# Patient Record
Sex: Male | Born: 1935 | Race: White | Hispanic: No | Marital: Married
Health system: Southern US, Community
[De-identification: ages and names within clinical notes are randomized; demographics above are authoritative.]

## PROBLEM LIST (undated history)

## (undated) DIAGNOSIS — C801 Malignant (primary) neoplasm, unspecified: Secondary | ICD-10-CM

## (undated) HISTORY — PX: APPENDECTOMY: SHX54

## (undated) HISTORY — DX: Malignant (primary) neoplasm, unspecified: C80.1

## (undated) HISTORY — PX: PROSTATE SURGERY: SHX751

## (undated) HISTORY — PX: KIDNEY STONE SURGERY: SHX686

---

## 2010-12-08 ENCOUNTER — Emergency Department: Payer: Self-pay | Admitting: Emergency Medicine

## 2018-12-06 ENCOUNTER — Other Ambulatory Visit: Payer: Self-pay

## 2018-12-06 ENCOUNTER — Ambulatory Visit: Payer: Medicare Other | Attending: Rehabilitative and Restorative Service Providers"

## 2018-12-06 DIAGNOSIS — M79601 Pain in right arm: Secondary | ICD-10-CM | POA: Insufficient documentation

## 2018-12-06 DIAGNOSIS — M79602 Pain in left arm: Secondary | ICD-10-CM | POA: Insufficient documentation

## 2018-12-06 DIAGNOSIS — M25511 Pain in right shoulder: Secondary | ICD-10-CM | POA: Diagnosis present

## 2018-12-06 DIAGNOSIS — G8929 Other chronic pain: Secondary | ICD-10-CM | POA: Insufficient documentation

## 2018-12-06 DIAGNOSIS — M25512 Pain in left shoulder: Secondary | ICD-10-CM | POA: Insufficient documentation

## 2018-12-06 NOTE — Therapy (Signed)
[facility] [address], [place], [address] Phone: [phone]   Fax:  [phone]  Physical Therapy Evaluation  Patient Details  Name: Evan Forbes. MRN: [id number] Date of Birth: 1931/12/01 Referring Provider (PT): Birdena Jubilee, [place]   Encounter Date: 12/06/2018  PT End of Session - 12/06/18 1459    Visit Number  1    Number of Visits  13    Date for PT Re-Evaluation  01/20/19    PT Start Time  1501    PT Stop Time  1602    PT Time Calculation (min)  61 min    Activity Tolerance  Patient tolerated treatment well    Behavior During Therapy  [facility] for tasks assessed/performed       Past Medical History:  Diagnosis Date  . 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 APPENDECTOMY    . KIDNEY STONE SURGERY    . PROSTATE SURGERY      There were no vitals filed for this visit.   Subjective Assessment - 12/06/18 1501    Subjective  R UE: anterior arm and lateral arm. No superior shoulder pain: 2/10 currently, 6/10 at worst for the past 2 months;  L UE pain: lateral arm, no superior shoulder pain: 0/10 currently 2/10 L lateral arm at worst for the past 2 months.    Pertinent History  B shoulder pain. Pt pulled a heavy barrel behind him with his shoulders in bilateral extension for 3 days. Neck pain started but it eased off after a shot in his neck for a while he could not move but no can. Took 3 months for his movements to return. Had a steroid shot in his R shoulder about 3 weeks ago which felt better temporarily. Shoulder pain comes and goes. Yesterday felt no pain but has pain today.Neck pain has not returned. Did pendulums B shoulders which helped.   B shoulder pain and neck began February 2020.   Patient Stated Goals  Be able to do his pistol shooting, lift about 40-50 lbs like he used too, such as a boat, log, or battery.    Currently  in Pain?  Yes    Pain Score  2     Pain Location  Arm    Pain Orientation  Right;Left    Pain Descriptors / Indicators  Tightness;Stabbing    Pain Type  Chronic pain    Pain Onset  More than a month ago    Pain Frequency  Occasional    Aggravating Factors   Moving his arms away from his side. Working at the computer 3-4 hours, lifting heavy items (about 20-30 lbs), pistol shooting (R shoulder scaption to 90 degrees, feels pain along the C5/6 dermatome). Rolling onto his R or L shoulder    Pain Relieving Factors  Keeping his arms at his side.         [facility] PT Assessment - 12/06/18 1516      Assessment   Medical Diagnosis  Bilateral shoulder pain    Referring Provider (PT)  Birdena Jubilee, PA-C    Onset Date/Surgical Date  11/09/18    Hand Dominance  Right    Prior Therapy  No known PT for current condition      Precautions   Precaution Comments  no known precautions      Restrictions   Other  Position/Activity Restrictions  No known restrictions      Balance Screen   Has the patient fallen in the past 6 months  No    Has the patient had a decrease in activity level because of a fear of falling?   No    Is the patient reluctant to leave their home because of a fear of falling?   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   (-)  Michel Bickers R, and L. Posterior medial arm tightness R for Yocum test. (-) Yocum test L. (+) Neer's impingement R and L shoulder      Posture/Postural Control   Posture Comments  protracted neck, B protracted shoulders with shoulder shrugs, kyphosis, R shoulder slightly lower, R greater trochanter and R knee higher. Neck: movement preference to C4/5 and C5/6      AROM   Right Shoulder Flexion  98 Degrees   110 AAROM, no pain, stiff end feel   Right Shoulder ABduction  92 Degrees    with pain; 112 AAROM, no pain    Left Shoulder Flexion  122 Degrees   130 AAROM with anterior arm tightness; stiff end feel   Left Shoulder ABduction  132 Degrees   with lateral arm tightness; 148 AAROM   Cervical Flexion  WFL    Cervical Extension  WFL with neck pain, no arm pain    Cervical - Right Side Bend  WFL    Cervical - Left Side Bend  WFL    Cervical - Right Rotation  WFL    Cervical - Left Rotation  Full with L superior lateral neck pain      Strength   Right Shoulder Flexion  4+/5    Right Shoulder ABduction  4+/5    Right Shoulder Internal Rotation  4+/5    Right Shoulder External Rotation  4/5    Left Shoulder Flexion  4+/5    Left Shoulder ABduction  4+/5    Left Shoulder Internal Rotation  4+/5    Left Shoulder External Rotation  4/5      Palpation   Palpation comment  No TTP B shoulders, R rhomboid tighness. Slight posterior shoulder tightness B                Objective measurements completed on examination: See above findings.    Pt states not taking any medications   No latex band allergies.   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  No blood pressure problems per pt  R hand dominant    Eventually work towards gym routine secondary to pt stating that he goes to a gym.    Medbridge Access Code: [email or web address]  Therapeutic exercise    B scapular retraction 10x2 with 5 seconds   Standing B shoulder ER yellow band 10x2    Improved exercise technique, movement at target joints, use of target muscles after mod verbal, visual, tactile cues.     105 degrees R shoulder flexion AROM afterwards. Tightness felt by pt at end range.    Pt is an 85 year old male who came to physical therapy secondary to B shoulder pain R > L  since February 2020. He also presents with B shoulder protraction and thoracic kyphosis, decreased B glenohumeral control, B  scapular weakness, positive special test suggesting bilateral shoulder impingement, infraspinatus and teres minor muscle weakness, and difficulty performing functional tasks such as reaching and carrying heavier items such as a log. Pt will benefit from continued skilled physical therapy services to decrease pain, improve posture, scapular and shoulder strength, and improve ability to perform functional tasks.      PT Short Term Goals - 12/06/18 1617      PT SHORT TERM GOAL #1   Title  Pt will be independent with his HEP to improve ability to reach and carry items with less shoulder and arm discomfort.    Baseline  Pt has started his HEP (12/06/2018)    Time  3    Period  Weeks    Status  New    Target Date  12/30/18                  PT Long Term Goals - 12/06/18 1618      PT LONG TERM GOAL #1   Title  Patient will have a decrease in R shoulder pain to 2/10 or less at worst and L shoulder pain to 1/10 or less at worst to promote ability to reach, as well as carry heavier items.    Baseline  6/10 R shoulder, 2/10 L shoulder pain at worst for the past 2 months (12/06/2018)    Time  6    Period  Weeks    Status  New    Target Date  01/20/19      PT LONG TERM GOAL #2   Title  Patient will improve R shoulder flexion and abduction AROM to 120 degrees or more to promote ability to reach as well as shoot his pistol.    Baseline  R shoulder AROM:  flexion 98 degrees, abduction 92 degrees (12/06/2018)    Time  6    Period  Weeks    Status  New    Target Date  01/20/19      PT LONG TERM GOAL #3   Title  Patient will improve L shoulder flexion and abduction AROM to at least 140 degrees to promote ability to reach more comfortably.    Baseline  R shoulder AROM: flexion 122 degrees, abduction 132 degrees (12/06/2018)    Time  6    Period  Weeks    Status  New    Target Date  01/20/19      PT LONG TERM GOAL #4   Title  Patient will improve R and L shoulder ER strength to at least  4+/5 to promote ability to reach with less pain.    Baseline  4/5 B shoulder ER strength (12/06/2018)    Time  6    Period  Weeks    Status  New    Target Date  01/20/19             Plan - 12/06/18 1611    Clinical Impression Statement  Pt is an 85 year old male who came to physical therapy secondary to B shoulder pain R > L since February 2020. He also presents with B shoulder protraction and thoracic kyphosis, decreased B glenohumeral control, B scapular weakness, positive special test suggesting bilateral shoulder impingement, infraspinatus and teres minor muscle weakness, and difficulty performing functional tasks such as reaching and carrying heavier items such as a log. Pt will benefit from  continued skilled physical therapy services to decrease pain, improve posture, scapular and shoulder strength, and improve ability to perform functional tasks.    Personal Factors and Comorbidities  Age;Time since onset of injury/illness/exacerbation    Examination-Activity Limitations  Lift;Reach Overhead;Carry    Stability/Clinical Decision Making  Stable/Uncomplicated    Clinical Decision Making  Low    Rehab Potential  Good    PT Frequency  2x / week    PT Duration  6 weeks    PT Treatment/Interventions  Electrical Stimulation;Iontophoresis 4mg /ml Dexamethasone;Therapeutic activities;Therapeutic exercise;Neuromuscular re-education;Patient/family education;Manual techniques;Passive range of motion;Dry needling;Joint Manipulations;Spinal Manipulations   manipulations if appropriate   PT Next Visit Plan  scapular and ER muscle strengthening, glenohumeral control, manual techniques, modalities PRN    PT Home Exercise Plan  Medbridge Access Code: [email or web address]    HKGOVPCHE and Agree with Plan of Care  Patient       Patient will benefit from skilled therapeutic intervention in order to improve the following deficits and impairments:  Decreased range of motion, Decreased strength, Hypomobility,  Impaired UE functional use, Improper body mechanics, Postural dysfunction, Pain  Visit Diagnosis: 1. Pain in right arm   2. Chronic right shoulder pain   3. Chronic left shoulder pain   4. Pain in left arm        Problem List There are no active problems to display for this patient.  Joneen Boers PT, DPT   12/06/2018, 4:53 PM  [facility] [address] [place], [address] Phone: [phone]   Fax:  [phone]  Name: Evan Forbes. MRN: [id number] Date of Birth: [birth year]

## 2018-12-06 NOTE — Patient Instructions (Signed)
Medbridge Access Code: [email or web address]   Seated Scapular Retraction 10x3 with 5 seconds

## 2018-12-08 ENCOUNTER — Ambulatory Visit: Payer: Medicare Other

## 2018-12-09 ENCOUNTER — Ambulatory Visit: Payer: Medicare Other | Attending: Rehabilitative and Restorative Service Providers"

## 2018-12-09 ENCOUNTER — Other Ambulatory Visit: Payer: Self-pay

## 2018-12-09 DIAGNOSIS — M25512 Pain in left shoulder: Secondary | ICD-10-CM | POA: Diagnosis present

## 2018-12-09 DIAGNOSIS — M25511 Pain in right shoulder: Secondary | ICD-10-CM | POA: Insufficient documentation

## 2018-12-09 DIAGNOSIS — M79602 Pain in left arm: Secondary | ICD-10-CM | POA: Insufficient documentation

## 2018-12-09 DIAGNOSIS — M79601 Pain in right arm: Secondary | ICD-10-CM | POA: Diagnosis present

## 2018-12-09 DIAGNOSIS — G8929 Other chronic pain: Secondary | ICD-10-CM | POA: Diagnosis present

## 2018-12-09 NOTE — Patient Instructions (Signed)
Medbridge Access Code: [email or web address]  Seated chin tucks with scapular retraction 10x3 with 5 second holds

## 2018-12-09 NOTE — Therapy (Signed)
[facility] [address], [place], [address] Phone: [phone]   Fax:  [phone]  Physical Therapy Treatment  Patient Details  Name: Evan Forbes. MRN: [id number] Date of Birth: 04/04/1932 Referring Provider (PT): Birdena Jubilee, [place]   Encounter Date: 12/09/2018  PT End of Session - 12/09/18 1701    Visit Number  2    Number of Visits  13    Date for PT Re-Evaluation  01/20/19    PT Start Time  1701    PT Stop Time  1751    PT Time Calculation (min)  50 min    Activity Tolerance  Patient tolerated treatment well    Behavior During Therapy  [facility] for tasks assessed/performed       Past Medical History:  Diagnosis Date  . 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 APPENDECTOMY    . KIDNEY STONE SURGERY    . PROSTATE SURGERY      There were no vitals filed for this visit.  Subjective Assessment - 12/09/18 1701    Subjective  Shoulders are not as good. Less than 1/10 both shoulders but feels pain along bottom of his arms when he raises them up to the side.    Pertinent History  B shoulder pain. Pt pulled a heavy barrel behind him with his shoulders in bilateral extension for 3 days. Neck pain started but it eased off after a shot in his neck for a while he could not move but no can. Took 3 months for his movements to return. Had a steroid shot in his R shoulder about 3 weeks ago which felt better temporarily. Shoulder pain comes and goes. Yesterday felt no pain but has pain today.Neck pain has not returned. Did pendulums B shoulders which helped.   B shoulder pain and neck began February 2020.   Patient Stated Goals  Be able to do his pistol shooting, lift about 40-50 lbs like he used too, such as a boat, log, or battery.    Currently in Pain?  Yes    Pain Score  1     Pain Onset  More than a month ago                                PT Education - 12/09/18 1752    Education Details  ther-ex, HEP    Person(s) Educated  Patient    Methods  Explanation;Demonstration;Tactile cues;Verbal cues;Handout    Comprehension  Returned demonstration;Verbalized understanding       Objectives  Medbridge Access Code: [email or web address]  Therapeutic exercise  R Isometric shoulder  ER 10x5 seconds for 2 sets. Good posterior glide of humeral head felt  IR 10x5 seconds  Flexion 10x5 seconds for 2 sets  Seated self inferior shoulder glide  R 10x5 seconds for 2 sets   R shoulder abduction: R radial nerve tension   Seated chin tucks with scapular retraction 10x3 with 5 second holds to promote upper thoracic extension, decreased lower cervical pressure, and improve scapular retraction and posterior tipping.    Improved R shoulder flexion and abduction AROM, no R radial nerve symptoms afterwards.   Improved exercise technique, movement at target joints, use of target muscles after mod verbal, visual, tactile cues.     Manual therapy Seated STM R  upper trap/rhomboid, and infraspinatus muscles     Response to treatment Difficulty with scapular control but improves with cues. Improved R shoulder AROM with decreased pain after session.    Clinical impression Improved R shoulder flexion and abduction AROM observed and decreased R radial nerve symptoms after promoting upper thoracic extension, and scapular retraction. Pt needed moderate cueing to decrease shoulder shrug with exercises. Pt tolerated session well without aggravation of symptoms. Pt will benefit from continued skilled physical therapy services to decrease pain, improve shoulder AROM, strength, and function.        PT Short Term Goals - 12/06/18 1617      PT SHORT TERM GOAL #1   Title  Pt will be independent with his HEP to improve ability to reach and carry items with less shoulder and arm discomfort.    Baseline  Pt  has started his HEP (12/06/2018)    Time  3    Period  Weeks    Status  New    Target Date  12/30/18        PT Long Term Goals - 12/06/18 1618      PT LONG TERM GOAL #1   Title  Patient will have a decrease in R shoulder pain to 2/10 or less at worst and L shoulder pain to 1/10 or less at worst to promote ability to reach, as well as carry heavier items.    Baseline  6/10 R shoulder, 2/10 L shoulder pain at worst for the past 2 months (12/06/2018)    Time  6    Period  Weeks    Status  New    Target Date  01/20/19      PT LONG TERM GOAL #2   Title  Patient will improve R shoulder flexion and abduction AROM to 120 degrees or more to promote ability to reach as well as shoot his pistol.    Baseline  R shoulder AROM:  flexion 98 degrees, abduction 92 degrees (12/06/2018)    Time  6    Period  Weeks    Status  New    Target Date  01/20/19      PT LONG TERM GOAL #3   Title  Patient will improve L shoulder flexion and abduction AROM to at least 140 degrees to promote ability to reach more comfortably.    Baseline  R shoulder AROM: flexion 122 degrees, abduction 132 degrees (12/06/2018)    Time  6    Period  Weeks    Status  New    Target Date  01/20/19      PT LONG TERM GOAL #4   Title  Patient will improve R and L shoulder ER strength to at least 4+/5 to promote ability to reach with less pain.    Baseline  4/5 B shoulder ER strength (12/06/2018)    Time  6    Period  Weeks    Status  New    Target Date  01/20/19            Plan - 12/09/18 1701    Clinical Impression Statement  Improved R shoulder flexion and abduction AROM observed and decreased R radial nerve symptoms after promoting upper thoracic extension, and scapular retraction. Pt needed moderate cueing to decrease shoulder shrug with exercises. Pt tolerated session well without aggravation of symptoms. Pt will benefit from continued skilled physical therapy services to decrease pain, improve shoulder AROM, strength,  and function.    Personal Factors and  Comorbidities  Age;Time since onset of injury/illness/exacerbation    Examination-Activity Limitations  Lift;Reach Overhead;Carry    Stability/Clinical Decision Making  Stable/Uncomplicated    Rehab Potential  Good    PT Frequency  2x / week    PT Duration  6 weeks    PT Treatment/Interventions  Electrical Stimulation;Iontophoresis 4mg /ml Dexamethasone;Therapeutic activities;Therapeutic exercise;Neuromuscular re-education;Patient/family education;Manual techniques;Passive range of motion;Dry needling;Joint Manipulations;Spinal Manipulations   manipulations if appropriate   PT Next Visit Plan  scapular and ER muscle strengthening, glenohumeral control, manual techniques, modalities PRN    PT Home Exercise Plan  Medbridge Access Code: [email or web address]    [email or web address] and Agree with Plan of Care  Patient       Patient will benefit from skilled therapeutic intervention in order to improve the following deficits and impairments:  Decreased range of motion, Decreased strength, Hypomobility, Impaired UE functional use, Improper body mechanics, Postural dysfunction, Pain  Visit Diagnosis: 1. Chronic right shoulder pain   2. Chronic left shoulder pain   3. Pain in right arm   4. Pain in left arm        Problem List There are no active problems to display for this patient.  Joneen Boers PT, DPT   12/09/2018, 6:07 PM  [facility] [address] [place], [address] Phone: [phone]   Fax:  [phone]  Name: Evan Forbes. MRN: [id number] Date of Birth: Mar 22, 1932

## 2018-12-13 ENCOUNTER — Ambulatory Visit: Payer: Medicare Other

## 2018-12-14 ENCOUNTER — Ambulatory Visit: Payer: Medicare Other

## 2018-12-14 ENCOUNTER — Other Ambulatory Visit: Payer: Self-pay

## 2018-12-14 DIAGNOSIS — M79602 Pain in left arm: Secondary | ICD-10-CM

## 2018-12-14 DIAGNOSIS — M79601 Pain in right arm: Secondary | ICD-10-CM

## 2018-12-14 DIAGNOSIS — G8929 Other chronic pain: Secondary | ICD-10-CM

## 2018-12-14 DIAGNOSIS — M25511 Pain in right shoulder: Secondary | ICD-10-CM | POA: Diagnosis not present

## 2018-12-14 NOTE — Therapy (Signed)
[facility] [address] [place], [address] Phone: [phone]   Fax:  [phone]  Physical Therapy Treatment  Patient Details  Name: Evan Forbes. MRN: [id number] Date of Birth: Jun 22, 1931 Referring Provider (PT): Birdena Jubilee, [place]   Encounter Date: 12/14/2018  PT End of Session - 12/14/18 1503    Visit Number  3    Number of Visits  13    Date for PT Re-Evaluation  01/20/19    PT Start Time  1503    PT Stop Time  1552    PT Time Calculation (min)  49 min    Activity Tolerance  Patient tolerated treatment well    Behavior During Therapy  [facility] for tasks assessed/performed       Past Medical History:  Diagnosis Date  . 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 APPENDECTOMY    . KIDNEY STONE SURGERY    . PROSTATE SURGERY      There were no vitals filed for this visit.  Subjective Assessment - 12/14/18 1504    Subjective  The shoulders are coming along. The arms feel weak. No pain currently. Still feels tighness R inferior arm (radial nerve) with shoulder scaption. No symptoms L shoulder currently.    Pertinent History  B shoulder pain. Pt pulled a heavy barrel behind him with his shoulders in bilateral extension for 3 days. Neck pain started but it eased off after a shot in his neck for a while he could not move but no can. Took 3 months for his movements to return. Had a steroid shot in his R shoulder about 3 weeks ago which felt better temporarily. Shoulder pain comes and goes. Yesterday felt no pain but has pain today.Neck pain has not returned. Did pendulums B shoulders which helped.   B shoulder pain and neck began February 2020.   Patient Stated Goals  Be able to do his pistol shooting, lift about 40-50 lbs like he used too, such as a boat, log, or battery.    Currently in Pain?  No/denies    Pain Score  1    0.5/10 R  shoulder   Pain Onset  More than a month ago                               PT Education - 12/14/18 1555    Education Details  ther-ex    Person(s) Educated  Patient    Methods  Explanation;Demonstration;Tactile cues;Verbal cues    Comprehension  Returned demonstration;Verbalized understanding      Objectives  MedbridgeAccess Code: [email or web address]  Manual therapy  Supine STM R teres major  Supine STM  R pectoralis muscle to decrease tightness   improved R shoulder scaption AROM without R radial nerve symptoms.     Therapeutic exercise  Seated thoracic extension over chair 10x3 with 5 second holds to promote thoracic extension   Reclined position  Shoulder AAROM with PT    Flexion     R 10x   Standing door pectoralis stretch, arms and hands low  R 5x10 seconds  L 5x10 seconds   Standing shoulder ER at comfortable range  R yellow band 10x3  L yellow band 10x3   Standing B scapular retraction red band 10x5 seconds for 2 sets   Improved exercise  technique, movement at target joints, use of target muscles after mod verbal, visual, tactile cues.          Response to treatment Difficulty with scapular control but improves with cues. Improved R shoulder AROM with decreased pain after session.    Clinical impression Improved R shoulder scaption with improved comfort following treatment to decrease teres major, pectoralis major muscle tension as well as improving infraspinatus, and scapular retractor muscle use. Improved L shoulder scaption AROM and comfort level observed as well after infraspinatus and scapular muscle strengthening exercises. Pt tolerated session well without aggravation of symptoms. Pt will benefit from continued skilled physical therapy services to decrease pain, improve strength, AROM and function.       PT Short Term Goals - 12/06/18 1617      PT SHORT TERM GOAL #1   Title  Pt will be independent with his HEP to  improve ability to reach and carry items with less shoulder and arm discomfort.    Baseline  Pt has started his HEP (12/06/2018)    Time  3    Period  Weeks    Status  New    Target Date  12/30/18        PT Long Term Goals - 12/06/18 1618      PT LONG TERM GOAL #1   Title  Patient will have a decrease in R shoulder pain to 2/10 or less at worst and L shoulder pain to 1/10 or less at worst to promote ability to reach, as well as carry heavier items.    Baseline  6/10 R shoulder, 2/10 L shoulder pain at worst for the past 2 months (12/06/2018)    Time  6    Period  Weeks    Status  New    Target Date  01/20/19      PT LONG TERM GOAL #2   Title  Patient will improve R shoulder flexion and abduction AROM to 120 degrees or more to promote ability to reach as well as shoot his pistol.    Baseline  R shoulder AROM:  flexion 98 degrees, abduction 92 degrees (12/06/2018)    Time  6    Period  Weeks    Status  New    Target Date  01/20/19      PT LONG TERM GOAL #3   Title  Patient will improve L shoulder flexion and abduction AROM to at least 140 degrees to promote ability to reach more comfortably.    Baseline  R shoulder AROM: flexion 122 degrees, abduction 132 degrees (12/06/2018)    Time  6    Period  Weeks    Status  New    Target Date  01/20/19      PT LONG TERM GOAL #4   Title  Patient will improve R and L shoulder ER strength to at least 4+/5 to promote ability to reach with less pain.    Baseline  4/5 B shoulder ER strength (12/06/2018)    Time  6    Period  Weeks    Status  New    Target Date  01/20/19            Plan - 12/14/18 1502    Clinical Impression Statement  Improved R shoulder scaption with improved comfort following treatment to decrease teres major, pectoralis major muscle tension as well as improving infraspinatus, and scapular retractor muscle use. Improved L shoulder scaption AROM and comfort level observed as well after  infraspinatus and scapular muscle  strengthening exercises. Pt tolerated session well without aggravation of symptoms. Pt will benefit from continued skilled physical therapy services to decrease pain, improve strength, AROM and function.    Personal Factors and Comorbidities  Age;Time since onset of injury/illness/exacerbation    Examination-Activity Limitations  Lift;Reach Overhead;Carry    Stability/Clinical Decision Making  Stable/Uncomplicated    Rehab Potential  Good    PT Frequency  2x / week    PT Duration  6 weeks    PT Treatment/Interventions  Electrical Stimulation;Iontophoresis 4mg /ml Dexamethasone;Therapeutic activities;Therapeutic exercise;Neuromuscular re-education;Patient/family education;Manual techniques;Passive range of motion;Dry needling;Joint Manipulations;Spinal Manipulations   manipulations if appropriate   PT Next Visit Plan  scapular and ER muscle strengthening, glenohumeral control, manual techniques, modalities PRN    PT Home Exercise Plan  Medbridge Access Code: [email or web address]    [email or web address] and Agree with Plan of Care  Patient       Patient will benefit from skilled therapeutic intervention in order to improve the following deficits and impairments:  Decreased range of motion, Decreased strength, Hypomobility, Impaired UE functional use, Improper body mechanics, Postural dysfunction, Pain  Visit Diagnosis: 1. Chronic right shoulder pain   2. Chronic left shoulder pain   3. Pain in right arm   4. Pain in left arm        Problem List There are no active problems to display for this patient.   Joneen Boers PT, DPT   12/14/2018, 3:58 PM  [facility] [address], [place], [address] Phone: [phone]   Fax:  [phone]  Name: Evan Forbes. MRN: [id number] Date of Birth: 1932-03-23

## 2018-12-16 ENCOUNTER — Ambulatory Visit: Payer: Medicare Other

## 2018-12-21 ENCOUNTER — Ambulatory Visit: Payer: Medicare Other

## 2018-12-23 ENCOUNTER — Ambulatory Visit: Payer: Medicare Other

## 2018-12-23 ENCOUNTER — Other Ambulatory Visit: Payer: Self-pay

## 2018-12-23 DIAGNOSIS — M25512 Pain in left shoulder: Secondary | ICD-10-CM

## 2018-12-23 DIAGNOSIS — M79601 Pain in right arm: Secondary | ICD-10-CM

## 2018-12-23 DIAGNOSIS — M79602 Pain in left arm: Secondary | ICD-10-CM

## 2018-12-23 DIAGNOSIS — M25511 Pain in right shoulder: Secondary | ICD-10-CM | POA: Diagnosis not present

## 2018-12-23 DIAGNOSIS — G8929 Other chronic pain: Secondary | ICD-10-CM

## 2018-12-23 NOTE — Therapy (Signed)
[facility] [address], [place], [address] Phone: [phone]   Fax:  [phone]  Physical Therapy Treatment  Patient Details  Name: Evan Forbes. MRN: [id number] Date of Birth: 1931-12-16 Referring Provider (PT): Birdena Jubilee, [place]   Encounter Date: 12/23/2018  PT End of Session - 12/23/18 1644    Visit Number  4    Number of Visits  13    Date for PT Re-Evaluation  01/20/19    PT Start Time  3151    PT Stop Time  7616    PT Time Calculation (min)  40 min    Activity Tolerance  Patient tolerated treatment well;No increased pain    Behavior During Therapy  WFL for tasks assessed/performed       Past Medical History:  Diagnosis Date  . 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 APPENDECTOMY    . KIDNEY STONE SURGERY    . PROSTATE SURGERY      There were no vitals filed for this visit.  Subjective Assessment - 12/23/18 1643    Subjective  Pt doing well, pain remain next to none, but tightness persists in the Rt shoulder with related weakness.    Pertinent History  B shoulder pain. Pt pulled a heavy barrel behind him with his shoulders in bilateral extension for 3 days. Neck pain started but it eased off after a shot in his neck for a while he could not move but no can. Took 3 months for his movements to return. Had a steroid shot in his R shoulder about 3 weeks ago which felt better temporarily. Shoulder pain comes and goes. Yesterday felt no pain but has pain today.Neck pain has not returned. Did pendulums B shoulders which helped.    Currently in Pain?  No/denies       INTERVENTION  -bue shoulder table slides (flexion) 15x3secH (great response and improved motion) -supine wand flexion over towel roll 1x20x3sec  -Right shoulder long axis distraction 2x30sec -Right shoulder joint mobilization: 2x30sec each (posterior glid in  ER, lateral distraction at 90 degrees flexion, lateral distraction in neutral) the latter with  -Supine Right shoulder flexon 3x10 c 1lb, 2lb, 3lb.  -Supine Right shoulder IR from 90/90 ER (available end range ~70 degrees) to neutral. -Right standing Shoulder Flexion 2x10: 1lb, then 2lb (stopping at 90) -standing Right shoulder abduction 1x10 c 2lb weight (stopping at 90)  *pt has opted to move down to 1x weekly to simplify his busy life, hence he will need updated HEP for self mobilization, advanced stretching, and free weight strengthening, the latter at patient request.    PT Short Term Goals - 12/06/18 1617      PT SHORT TERM GOAL #1   Title  Pt will be independent with his HEP to improve ability to reach and carry items with less shoulder and arm discomfort.    Baseline  Pt has started his HEP (12/06/2018)    Time  3    Period  Weeks    Status  New    Target Date  12/30/18        PT Long Term Goals - 12/06/18 1618      PT LONG TERM GOAL #1   Title  Patient will have a decrease in R shoulder pain to 2/10 or less at worst and L shoulder pain to 1/10  or less at worst to promote ability to reach, as well as carry heavier items.    Baseline  6/10 R shoulder, 2/10 L shoulder pain at worst for the past 2 months (12/06/2018)    Time  6    Period  Weeks    Status  New    Target Date  01/20/19      PT LONG TERM GOAL #2   Title  Patient will improve R shoulder flexion and abduction AROM to 120 degrees or more to promote ability to reach as well as shoot his pistol.    Baseline  R shoulder AROM:  flexion 98 degrees, abduction 92 degrees (12/06/2018)    Time  6    Period  Weeks    Status  New    Target Date  01/20/19      PT LONG TERM GOAL #3   Title  Patient will improve L shoulder flexion and abduction AROM to at least 140 degrees to promote ability to reach more comfortably.    Baseline  R shoulder AROM: flexion 122 degrees, abduction 132 degrees (12/06/2018)    Time  6    Period   Weeks    Status  New    Target Date  01/20/19      PT LONG TERM GOAL #4   Title  Patient will improve R and L shoulder ER strength to at least 4+/5 to promote ability to reach with less pain.    Baseline  4/5 B shoulder ER strength (12/06/2018)    Time  6    Period  Weeks    Status  New    Target Date  01/20/19            Plan - 12/23/18 1650    Clinical Impression Statement  Continued to focus on improving shoulder ROM and strength, especially on Right. Pt has gradual improved ROM in Rt shoulder with repitition. Pt is mesmorized by improvements in ROM within session. Pt contnues to progress well, but must be encouraged to avoid severe pain with stretching.    Rehab Potential  Good    PT Frequency  2x / week    PT Treatment/Interventions  Electrical Stimulation;Iontophoresis 4mg /ml Dexamethasone;Therapeutic activities;Therapeutic exercise;Neuromuscular re-education;Patient/family education;Manual techniques;Passive range of motion;Dry needling;Joint Manipulations;Spinal Manipulations    PT Next Visit Plan  scapular and ER muscle strengthening, glenohumeral control, manual techniques, modalities PRN    PT Home Exercise Plan  Medbridge Access Code: [email or web address]    [email or web address] and Agree with Plan of Care  Patient       Patient will benefit from skilled therapeutic intervention in order to improve the following deficits and impairments:  Decreased range of motion, Decreased strength, Hypomobility, Impaired UE functional use, Improper body mechanics, Postural dysfunction, Pain  Visit Diagnosis: 1. Chronic right shoulder pain   2. Chronic left shoulder pain   3. Pain in right arm   4. Pain in left arm        Problem List There are no active problems to display for this patient.  5:16 PM, 12/23/18 Etta Grandchild, PT, DPT Physical Therapist - [place] [phone] (Office)    Jamien Casanova C 12/23/2018, 5:03 PM  [facility] [address], [place], [address] Phone: [phone]   Fax:  [phone]  Name: Evan Forbes. MRN: [id number] Date of Birth: February 20, 1932

## 2018-12-27 ENCOUNTER — Ambulatory Visit: Payer: Medicare Other

## 2018-12-29 ENCOUNTER — Ambulatory Visit: Payer: Medicare Other

## 2018-12-30 ENCOUNTER — Other Ambulatory Visit: Payer: Self-pay

## 2018-12-30 ENCOUNTER — Ambulatory Visit: Payer: Medicare Other

## 2018-12-30 DIAGNOSIS — M79602 Pain in left arm: Secondary | ICD-10-CM

## 2018-12-30 DIAGNOSIS — M25511 Pain in right shoulder: Secondary | ICD-10-CM | POA: Diagnosis not present

## 2018-12-30 DIAGNOSIS — M79601 Pain in right arm: Secondary | ICD-10-CM

## 2018-12-30 DIAGNOSIS — G8929 Other chronic pain: Secondary | ICD-10-CM

## 2018-12-30 NOTE — Patient Instructions (Addendum)
MedbridgeAccess Code: [email or web address]   Seated Shoulder Inferior Glide  10x2 with 10 seconds 1-2x daily   Shoulder flexion to 90 degrees 1-2 lbs each UE 10x2 daily.   shoulder abduction thumbs up to 90 degrees 1-2 lbs each UE 1x daily.   Seated thoracic extension 10x3 with 5 second holds 1-2x/day

## 2018-12-30 NOTE — Therapy (Signed)
[facility] [address], [place], [address] Phone: [phone]   Fax:  [phone]  Physical Therapy Treatment  Patient Details  Name: Evan Forbes. MRN: [id number] Date of Birth: [birth year] Referring Provider (PT): Birdena Jubilee, [place]   Encounter Date: 12/30/2018  PT End of Session - 12/30/18 1650    Visit Number  5    Number of Visits  13    Date for PT Re-Evaluation  01/20/19    PT Start Time  3785    PT Stop Time  8850    PT Time Calculation (min)  65 min    Activity Tolerance  Patient tolerated treatment well;No increased pain    Behavior During Therapy  WFL for tasks assessed/performed       Past Medical History:  Diagnosis Date  . 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 APPENDECTOMY    . KIDNEY STONE SURGERY    . PROSTATE SURGERY      There were no vitals filed for this visit.  Subjective Assessment - 12/30/18 1652    Subjective  Shoulders are doing pretty good. The exercises and R shoulder joint mobilization last session helped for 2 days. Went pistol shooting with R UE today, no pain but tightness.  Has more motion currently. Has R UE pain around C6 dermatome when working on the computer.    Pertinent History  B shoulder pain. Pt pulled a heavy barrel behind him with his shoulders in bilateral extension for 3 days. Neck pain started but it eased off after a shot in his neck for a while he could not move but no can. Took 3 months for his movements to return. Had a steroid shot in his R shoulder about 3 weeks ago which felt better temporarily. Shoulder pain comes and goes. Yesterday felt no pain but has pain today.Neck pain has not returned. Did pendulums B shoulders which helped.    Currently in Pain?  Yes   no complain of pain   Pain Score  1    0.5/10 R shoulder                               PT Education - 12/30/18 1803    Education Details  ther-ex, HEP    Person(s) Educated  Patient    Methods  Explanation;Demonstration;Tactile cues;Handout;Verbal cues    Comprehension  Returned demonstration;Verbalized understanding      Objectives  MedbridgeAccess Code: [email or web address]  Manual therapy   supine R shoulder in abduction around 90 degrees  Posterior, inferior, posterior inferior glide grade 3+ to promote joint mobility and decrease stiffeness  Supine R shoulder long axis distraction 2x30 second s  Supine STM R teres major   Supine STM  R pectoralis muscle to decrease tightness  Seated STM R upper trap muscles  120 degrees R shoulder flexion AROM  137 degrees L shoulder flexion AROM               Therapeutic exercise  Recommended pt to maintain B scapular retraction and chin tuck while working at his computer to help decrease R UE discomfort. Pt verbalized understanding.   Standing shoulder flexion AROM multiple times to test effectiveness of treatment  R 116 degrees at start of session  R shoulder improved to around 122 degrees later  in the session   Seated R shoulder inferior glide self mobilization 10x10 seconds for 2 sets  Table push-up position  R weight shift to promote posterior joint mobilization 10x10 seconds   Seated thoracic extension over chair 10x5 seconds    Reviewed HEP.  Pt demonstrated and verbalized understanding. Handout via medbridge provided,       Improved exercise technique, movement at target joints, use of target muscles after mod verbal, visual, tactile cues.          Response to treatment Improved R shoulder AROM with decreased pain after session. Pt states R shoulder feeling good with increased movement.    Clinical impression Continued working on improving R shoulder joint mobility as well as decreasing pectoralis, teres major and upper trap  tightness to promote better glenohumeral movement when raising his R arm. Improved motion and comfort level with R arm movements after session reported by pt. Pt will benefit from continued skilled physical therapy services to decrease pain, improve strength, mechanics, and function.      PT Short Term Goals - 12/06/18 1617      PT SHORT TERM GOAL #1   Title  Pt will be independent with his HEP to improve ability to reach and carry items with less shoulder and arm discomfort.    Baseline  Pt has started his HEP (12/06/2018)    Time  3    Period  Weeks    Status  New    Target Date  12/30/18        PT Long Term Goals - 12/06/18 1618      PT LONG TERM GOAL #1   Title  Patient will have a decrease in R shoulder pain to 2/10 or less at worst and L shoulder pain to 1/10 or less at worst to promote ability to reach, as well as carry heavier items.    Baseline  6/10 R shoulder, 2/10 L shoulder pain at worst for the past 2 months (12/06/2018)    Time  6    Period  Weeks    Status  New    Target Date  01/20/19      PT LONG TERM GOAL #2   Title  Patient will improve R shoulder flexion and abduction AROM to 120 degrees or more to promote ability to reach as well as shoot his pistol.    Baseline  R shoulder AROM:  flexion 98 degrees, abduction 92 degrees (12/06/2018)    Time  6    Period  Weeks    Status  New    Target Date  01/20/19      PT LONG TERM GOAL #3   Title  Patient will improve L shoulder flexion and abduction AROM to at least 140 degrees to promote ability to reach more comfortably.    Baseline  R shoulder AROM: flexion 122 degrees, abduction 132 degrees (12/06/2018)    Time  6    Period  Weeks    Status  New    Target Date  01/20/19      PT LONG TERM GOAL #4   Title  Patient will improve R and L shoulder ER strength to at least 4+/5 to promote ability to reach with less pain.    Baseline  4/5 B shoulder ER strength (12/06/2018)    Time  6    Period  Weeks    Status  New     Target Date  01/20/19  Plan - 12/30/18 1652    Clinical Impression Statement  Continued working on improving R shoulder joint mobility as well as decreasing pectoralis, teres major and upper trap tightness to promote better glenohumeral movement when raising his R arm. Improved motion and comfort level with R arm movements after session reported by pt. Pt will benefit from continued skilled physical therapy services to decrease pain, improve strength, mechanics, and function.    Rehab Potential  Good    PT Frequency  2x / week    PT Treatment/Interventions  Electrical Stimulation;Iontophoresis 4mg /ml Dexamethasone;Therapeutic activities;Therapeutic exercise;Neuromuscular re-education;Patient/family education;Manual techniques;Passive range of motion;Dry needling;Joint Manipulations;Spinal Manipulations    PT Next Visit Plan  scapular and ER muscle strengthening, glenohumeral control, manual techniques, modalities PRN    PT Home Exercise Plan  Medbridge Access Code: [email or web address]    [email or web address] and Agree with Plan of Care  Patient       Patient will benefit from skilled therapeutic intervention in order to improve the following deficits and impairments:  Decreased range of motion, Decreased strength, Hypomobility, Impaired UE functional use, Improper body mechanics, Postural dysfunction, Pain  Visit Diagnosis: 1. Chronic right shoulder pain   2. Chronic left shoulder pain   3. Pain in right arm   4. Pain in left arm        Problem List There are no active problems to display for this patient.   Joneen Boers PT, DPT   12/30/2018, 6:09 PM  [facility] [address], [place], [address] Phone: [phone]   Fax:  [phone]  Name: Zhi Geier. MRN: [id number] Date of Birth: Oct 11, 1931

## 2019-01-03 ENCOUNTER — Ambulatory Visit: Payer: Medicare Other

## 2019-01-06 ENCOUNTER — Ambulatory Visit: Payer: Medicare Other

## 2019-01-06 ENCOUNTER — Other Ambulatory Visit: Payer: Self-pay

## 2019-01-06 DIAGNOSIS — M25511 Pain in right shoulder: Secondary | ICD-10-CM

## 2019-01-06 DIAGNOSIS — M25512 Pain in left shoulder: Secondary | ICD-10-CM

## 2019-01-06 DIAGNOSIS — G8929 Other chronic pain: Secondary | ICD-10-CM

## 2019-01-06 NOTE — Therapy (Signed)
[facility] [address] [place], [address] Phone: [phone]   Fax:  [phone]  Physical Therapy Treatment  Patient Details  Name: Evan Forbes. MRN: [id number] Date of Birth: January 03, 1932 Referring Provider (PT): Birdena Jubilee, [place]   Encounter Date: 01/06/2019  PT End of Session - 01/06/19 1634    Visit Number  6    Number of Visits  13    Date for PT Re-Evaluation  01/20/19    PT Start Time  2831    PT Stop Time  1720    PT Time Calculation (min)  46 min    Activity Tolerance  Patient tolerated treatment well;No increased pain    Behavior During Therapy  WFL for tasks assessed/performed       Past Medical History:  Diagnosis Date  . 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 APPENDECTOMY    . KIDNEY STONE SURGERY    . PROSTATE SURGERY      There were no vitals filed for this visit.  Subjective Assessment - 01/06/19 1636    Subjective  Had a bad week. Must have had allergies. Felt weak. R shoulder felt loose and good after last session. R shoulder tight the next day. Later did his exercise which losened his shoulder back up again. More tightess than pain. Laid down on his L shoulder last night and felt a bee sting sensation L lateral shoulder. More difficulty with raising his L arm up today.    Pertinent History  B shoulder pain. Pt pulled a heavy barrel behind him with his shoulders in bilateral extension for 3 days. Neck pain started but it eased off after a shot in his neck for a while he could not move but no can. Took 3 months for his movements to return. Had a steroid shot in his R shoulder about 3 weeks ago which felt better temporarily. Shoulder pain comes and goes. Yesterday felt no pain but has pain today.Neck pain has not returned. Did pendulums B shoulders which helped.    Currently in Pain?  No/denies   but  tightness.                              PT Education - 01/06/19 1821    Education Details  ther-ex    Person(s) Educated  Patient    Methods  Explanation;Demonstration;Tactile cues;Verbal cues    Comprehension  Returned demonstration;Verbalized understanding       Objectives  MedbridgeAccess Code: [email or web address]  Manual therapy   supine R shoulder in abduction around 90 degrees              Posterior, inferior, posterior inferior glide grade 3+ to promote joint mobility and decrease stiffeness  Supine L shoulder in abduction      inferior, posterior inferior glide grade 3+ to promote joint mobility and decrease stiffeness  Supine R and L shoulder long axis distraction     Supine STM R teres major   Supine AAROM R and L shoulder flexion    Improved B shoulder flexion ROM afterwards     Therapeutic exercise  Seated manually resisted scapular retraction targeting lower trap muscles  R 10x3 with 5 second holds  Seated manually resisted R shouler ER isometrics 10x5 seconds for 2 sets  B shoulder flexion AROM  multiple times  Improved exercise technique, movement at target joints, use of target muscles after min to mod verbal, visual, tactile cues.       Response to treatment Improved B shoulder AROM with decreased stiffness after session.   Clinical impression Improved shoulder joint mobility and flexion AROM after treatment. Pt tolerated session well without aggravation of symptoms. Pt will benefit from continued skilled physical therapy services to decrease pain, improve strength and function.        PT Short Term Goals - 12/06/18 1617      PT SHORT TERM GOAL #1   Title  Pt will be independent with his HEP to improve ability to reach and carry items with less shoulder and arm discomfort.    Baseline  Pt has started his HEP (12/06/2018)    Time  3    Period  Weeks    Status  New    Target Date  12/30/18        PT  Long Term Goals - 12/06/18 1618      PT LONG TERM GOAL #1   Title  Patient will have a decrease in R shoulder pain to 2/10 or less at worst and L shoulder pain to 1/10 or less at worst to promote ability to reach, as well as carry heavier items.    Baseline  6/10 R shoulder, 2/10 L shoulder pain at worst for the past 2 months (12/06/2018)    Time  6    Period  Weeks    Status  New    Target Date  01/20/19      PT LONG TERM GOAL #2   Title  Patient will improve R shoulder flexion and abduction AROM to 120 degrees or more to promote ability to reach as well as shoot his pistol.    Baseline  R shoulder AROM:  flexion 98 degrees, abduction 92 degrees (12/06/2018)    Time  6    Period  Weeks    Status  New    Target Date  01/20/19      PT LONG TERM GOAL #3   Title  Patient will improve L shoulder flexion and abduction AROM to at least 140 degrees to promote ability to reach more comfortably.    Baseline  R shoulder AROM: flexion 122 degrees, abduction 132 degrees (12/06/2018)    Time  6    Period  Weeks    Status  New    Target Date  01/20/19      PT LONG TERM GOAL #4   Title  Patient will improve R and L shoulder ER strength to at least 4+/5 to promote ability to reach with less pain.    Baseline  4/5 B shoulder ER strength (12/06/2018)    Time  6    Period  Weeks    Status  New    Target Date  01/20/19            Plan - 01/06/19 1822    Clinical Impression Statement  Improved shoulder joint mobility and flexion AROM after treatment. Pt tolerated session well without aggravation of symptoms. Pt will benefit from continued skilled physical therapy services to decrease pain, improve strength and function.    Rehab Potential  Good    PT Frequency  2x / week    PT Treatment/Interventions  Electrical Stimulation;Iontophoresis 4mg /ml Dexamethasone;Therapeutic activities;Therapeutic exercise;Neuromuscular re-education;Patient/family education;Manual techniques;Passive range of  motion;Dry needling;Joint Manipulations;Spinal Manipulations    PT Next Visit Plan  scapular  and ER muscle strengthening, glenohumeral control, manual techniques, modalities PRN    PT Home Exercise Plan  Medbridge Access Code: [email or web address]    [email or web address] and Agree with Plan of Care  Patient       Patient will benefit from skilled therapeutic intervention in order to improve the following deficits and impairments:  Decreased range of motion, Decreased strength, Hypomobility, Impaired UE functional use, Improper body mechanics, Postural dysfunction, Pain  Visit Diagnosis: 1. Chronic right shoulder pain   2. Chronic left shoulder pain        Problem List There are no active problems to display for this patient.   Joneen Boers PT, DPT   01/06/2019, 6:30 PM  [facility] [address], [place], [address] Phone: [phone]   Fax:  [phone]  Name: Giulian Goldring. MRN: [id number] Date of Birth: [birth year]

## 2019-01-13 ENCOUNTER — Other Ambulatory Visit: Payer: Self-pay

## 2019-01-13 ENCOUNTER — Ambulatory Visit: Payer: Medicare Other | Attending: Rehabilitative and Restorative Service Providers"

## 2019-01-13 DIAGNOSIS — M79602 Pain in left arm: Secondary | ICD-10-CM | POA: Insufficient documentation

## 2019-01-13 DIAGNOSIS — M25511 Pain in right shoulder: Secondary | ICD-10-CM | POA: Diagnosis present

## 2019-01-13 DIAGNOSIS — M25512 Pain in left shoulder: Secondary | ICD-10-CM | POA: Diagnosis present

## 2019-01-13 DIAGNOSIS — G8929 Other chronic pain: Secondary | ICD-10-CM | POA: Insufficient documentation

## 2019-01-13 DIAGNOSIS — M79601 Pain in right arm: Secondary | ICD-10-CM | POA: Diagnosis present

## 2019-01-13 NOTE — Therapy (Signed)
[facility] [address], [place], [address] Phone: [phone]   Fax:  [phone]  Physical Therapy Treatment  Patient Details  Name: Evan Forbes. MRN: [id number] Date of Birth: June 25, 1931 Referring Provider (PT): Birdena Jubilee, [place]   Encounter Date: 01/13/2019  PT End of Session - 01/13/19 1646    Visit Number  7    Number of Visits  13    Date for PT Re-Evaluation  01/20/19    PT Start Time  1630    PT Stop Time  6333    PT Time Calculation (min)  16 min    Activity Tolerance  Patient tolerated treatment well;No increased pain    Behavior During Therapy  WFL for tasks assessed/performed       Past Medical History:  Diagnosis Date  . 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 APPENDECTOMY    . KIDNEY STONE SURGERY    . PROSTATE SURGERY      There were no vitals filed for this visit.  Subjective Assessment - 01/13/19 1750    Subjective  Pt states that his shoulder feels better and now his shoulder just hurts when he works on the computer. Does exercises in the water which helps with his ROM    Pertinent History  B shoulder pain. Pt pulled a heavy barrel behind him with his shoulders in bilateral extension for 3 days. Neck pain started but it eased off after a shot in his neck for a while he could not move but no can. Took 3 months for his movements to return. Had a steroid shot in his R shoulder about 3 weeks ago which felt better temporarily. Shoulder pain comes and goes. Yesterday felt no pain but has pain today.Neck pain has not returned. Did pendulums B shoulders which helped.    Currently in Pain?  No/denies                              Objectives  MedbridgeAccess Code: [email or web address]  Manual therapy  supine R shoulder in abduction around 90 degrees  Posterior, inferior, posterior  inferior glide grade 3+ to promote joint mobility and decrease stiffeness   Supine R and L shoulder long axis distraction     Supine STM R teres major    Response to treatment Improved B shoulder AROM with decreased stiffness after session.  Clinical impression   Pt arrived late. Session adjusted accordingly. Continued performing manual therapy to promote mobility at his R glenohumeral joint. Improved mobility palpated after manual therapy. Pt states R shoulder feels more loose after session. Pt will benefit from continued skilled physical therapy services to improve shoulder AROM, UE strength, and function.      PT Short Term Goals - 12/06/18 1617      PT SHORT TERM GOAL #1   Title  Pt will be independent with his HEP to improve ability to reach and carry items with less shoulder and arm discomfort.    Baseline  Pt has started his HEP (12/06/2018)    Time  3    Period  Weeks    Status  New    Target Date  12/30/18        PT Long Term Goals - 12/06/18 1618      PT LONG TERM GOAL #1  Title  Patient will have a decrease in R shoulder pain to 2/10 or less at worst and L shoulder pain to 1/10 or less at worst to promote ability to reach, as well as carry heavier items.    Baseline  6/10 R shoulder, 2/10 L shoulder pain at worst for the past 2 months (12/06/2018)    Time  6    Period  Weeks    Status  New    Target Date  01/20/19      PT LONG TERM GOAL #2   Title  Patient will improve R shoulder flexion and abduction AROM to 120 degrees or more to promote ability to reach as well as shoot his pistol.    Baseline  R shoulder AROM:  flexion 98 degrees, abduction 92 degrees (12/06/2018)    Time  6    Period  Weeks    Status  New    Target Date  01/20/19      PT LONG TERM GOAL #3   Title  Patient will improve L shoulder flexion and abduction AROM to at least 140 degrees to promote ability to reach more comfortably.    Baseline  R shoulder AROM: flexion 122 degrees,  abduction 132 degrees (12/06/2018)    Time  6    Period  Weeks    Status  New    Target Date  01/20/19      PT LONG TERM GOAL #4   Title  Patient will improve R and L shoulder ER strength to at least 4+/5 to promote ability to reach with less pain.    Baseline  4/5 B shoulder ER strength (12/06/2018)    Time  6    Period  Weeks    Status  New    Target Date  01/20/19            Plan - 01/13/19 1752    Clinical Impression Statement  Pt arrived late. Session adjusted accordingly. Continued performing manual therapy to promote mobility at his R glenohumeral joint. Improved mobility palpated after manual therapy. Pt states R shoulder feels more loose after session. Pt will benefit from continued skilled physical therapy services to improve shoulder AROM, UE strength, and function.    Rehab Potential  Good    PT Frequency  2x / week    PT Treatment/Interventions  Electrical Stimulation;Iontophoresis 4mg /ml Dexamethasone;Therapeutic activities;Therapeutic exercise;Neuromuscular re-education;Patient/family education;Manual techniques;Passive range of motion;Dry needling;Joint Manipulations;Spinal Manipulations    PT Next Visit Plan  scapular and ER muscle strengthening, glenohumeral control, manual techniques, modalities PRN    PT Home Exercise Plan  Medbridge Access Code: [email or web address]    [email or web address] and Agree with Plan of Care  Patient       Patient will benefit from skilled therapeutic intervention in order to improve the following deficits and impairments:  Decreased range of motion, Decreased strength, Hypomobility, Impaired UE functional use, Improper body mechanics, Postural dysfunction, Pain  Visit Diagnosis: 1. Chronic right shoulder pain   2. Chronic left shoulder pain   3. Pain in right arm   4. Pain in left arm        Problem List There are no active problems to display for this patient.   Joneen Boers PT, DPT   01/13/2019, 6:03 PM  [facility] [address], [place], [address] Phone: [phone]   Fax:  [phone]  Name: Evan Forbes. MRN: [id number] Date of Birth: [birth year]

## 2019-01-20 ENCOUNTER — Ambulatory Visit: Payer: Medicare Other

## 2019-01-27 ENCOUNTER — Ambulatory Visit: Payer: Medicare Other

## 2019-01-27 ENCOUNTER — Other Ambulatory Visit: Payer: Self-pay

## 2019-01-27 DIAGNOSIS — G8929 Other chronic pain: Secondary | ICD-10-CM

## 2019-01-27 DIAGNOSIS — M79602 Pain in left arm: Secondary | ICD-10-CM

## 2019-01-27 DIAGNOSIS — M79601 Pain in right arm: Secondary | ICD-10-CM

## 2019-01-27 DIAGNOSIS — M25512 Pain in left shoulder: Secondary | ICD-10-CM

## 2019-01-27 DIAGNOSIS — M25511 Pain in right shoulder: Secondary | ICD-10-CM | POA: Diagnosis not present

## 2019-01-27 NOTE — Patient Instructions (Addendum)
  Access Code: [email or web address]  URL: [email or web address]  Date: 01/27/2019  Prepared by: Joneen Boers   Exercises Seated Scapular Retraction - 10 reps - 3 sets - 5 hold - 1x daily - 7x weekly Seated Cervical Retraction - 10 reps - 3 sets - 5 seconds hold - 1x daily - 7x weekly Seated Shoulder Inferior Glide - 10 reps - 2 sets - 10 seconds hold - 1-2x daily - 7x weekly Seated Thoracic Lumbar Extension - 10 reps - 3 sets - 5 seconds hold - 1-2x daily - 7x weekly Single Arm Shoulder Flexion with Dumbbell - 10 reps - 2 sets - 1x daily - 7x weekly Standing Single Arm Shoulder Abduction with Dumbbell - Thumb Up - 10 reps - 1 sets - 1x daily - 7x weekly Scaption Wall Slide with Towel - 10 reps - 1-2 sets - 5 seconds hold - 1x daily - 7x weekly Shoulder Flexion Wall Slide with Towel - 10 reps - 1-2 sets - 5 seconds hold - 1x daily - 7x weekly

## 2019-01-27 NOTE — Therapy (Signed)
[facility] [address], [place], [address] Phone: [phone]   Fax:  [phone]  Physical Therapy Treatment  Patient Details  Name: Evan Forbes. MRN: [id number] Date of Birth: Nov 15, 1931 Referring Provider (PT): Birdena Jubilee, [place]   Encounter Date: 01/27/2019  PT End of Session - 01/27/19 1301    Visit Number  8    Number of Visits  16    Date for PT Re-Evaluation  03/10/19    PT Start Time  8850    PT Stop Time  1344    PT Time Calculation (min)  42 min    Activity Tolerance  Patient tolerated treatment well;No increased pain    Behavior During Therapy  WFL for tasks assessed/performed       Past Medical History:  Diagnosis Date  . 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 APPENDECTOMY    . KIDNEY STONE SURGERY    . PROSTATE SURGERY      There were no vitals filed for this visit.  Subjective Assessment - 01/27/19 1303    Subjective  Shoulders are doing pretty good. Feels like his arms has to do something with how he sleeps. Needs to help his arm a little bit to lift weight initially. Now can hold a 4 lb weight in about 90 degree flexion for about 15 seconds. 0.5 to 1/10 both shoulders currently. 2/10 R shoulder pain at most 0.5/10 L shoulder pain at most for the past 7 days. Feels like he can do exercises at home but want to continue 1x every 2 weeks for the next 6 weeks.    Pertinent History  B shoulder pain. Pt pulled a heavy barrel behind him with his shoulders in bilateral extension for 3 days. Neck pain started but it eased off after a shot in his neck for a while he could not move but no can. Took 3 months for his movements to return. Had a steroid shot in his R shoulder about 3 weeks ago which felt better temporarily. Shoulder pain comes and goes. Yesterday felt no pain but has pain today.Neck pain has not returned. Did  pendulums B shoulders which helped.    Currently in Pain?  Yes    Pain Score  1                                PT Education - 01/27/19 1309    Education Details  ther-ex    Person(s) Educated  Patient    Methods  Explanation;Demonstration;Tactile cues;Verbal cues    Comprehension  Returned demonstration;Verbalized understanding      Objectives  MedbridgeAccess Code: [email or web address]     Therapeutic exercise   Reviewed plan of care: 1x/2 weeks for 6 weeks per pt input to continue to make sure he continues to go in the right direction. Pt to let us know if he no longer needs in clinic PT. Pt verbalized understanding.   standing shoulder AROM 1x each way for each UE  Flexion: 120 degrees R, 147 degrees L  Abduction: 118 degrees R, 141 degrees L  Manually resisted shoulder ER 1x each  R: 4+/5   L: 4+/5  Reviewed progress/ current status with PT towards goals  Towel slides up wall R UE  Flexion 10x5 seconds for 2 sets  scaption 10x5 seconds for 2 sets   126 degrees R shoulder flexion, 130 degrees R shoulder abduction afterwards  Reviewed and given as part of his HEP. Pt demonstrated and verbalized understanding. Handout provided.    Reviewed tips for preventing re-injury when performing yard work such as keep loads light and close when lifting, and keep the cart light when pulling it and do not let his shoulders protract to prevent irritation to his tissues. Pt verbalized understanding.    Improved exercise technique, movement at target joints, use of target muscles after min to mod verbal, visual, tactile cues.     Response to treatment Improved R shoulder AROM with decreased stiffness after session.  Clinical impression Pt demonstrates improved B shoulder pain, AROM and function since initial evaluation. He also demonstrates independence and compliance with his HEP. Pt will benefit from continued skilled PT services per pt request to  make sure he continues to progress in the right direction (improve strength ROM, function and decrease pain).      PT Short Term Goals - 01/27/19 1320      PT SHORT TERM GOAL #1   Title  Pt will be independent with his HEP to improve ability to reach and carry items with less shoulder and arm discomfort.    Baseline  Pt has started his HEP (12/06/2018); Pt demonstrates independence with his HEP (01/27/2019)    Time  3    Period  Weeks    Status  Achieved    Target Date  12/30/18        PT Long Term Goals - 01/27/19 1309      PT LONG TERM GOAL #1   Title  Patient will have a decrease in R shoulder pain to 2/10 or less at worst and L shoulder pain to 1/10 or less at worst to promote ability to reach, as well as carry heavier items.    Baseline  6/10 R shoulder, 2/10 L shoulder pain at worst for the past 2 months (12/06/2018); 2/10 R shoulder pain at most, 0.5/10 L shoulder pain at most for the past 7 days (01/27/2019)    Time  6    Period  Weeks    Status  Achieved    Target Date  01/20/19      PT LONG TERM GOAL #2   Title  Patient will improve R shoulder flexion and abduction AROM to 120 degrees or more to promote ability to reach as well as shoot his pistol.    Baseline  R shoulder AROM:  flexion 98 degrees, abduction 92 degrees (12/06/2018); 120 flexion, 118 abduction (01/27/2019)    Time  6    Period  Weeks    Status  Partially Met    Target Date  03/10/19      PT LONG TERM GOAL #3   Title  Patient will improve L shoulder flexion and abduction AROM to at least 140 degrees to promote ability to reach more comfortably.    Baseline  R shoulder AROM: flexion 122 degrees, abduction 132 degrees (12/06/2018); 147 degrees flexion, 141 degrees abduction (01/27/2019)    Time  6    Period  Weeks    Status  Achieved    Target Date  01/20/19      PT LONG TERM GOAL #4   Title  Patient will improve R and L shoulder ER strength to at least 4+/5 to promote ability to reach with less pain.     Baseline  4/5 B  shoulder ER strength (12/06/2018); 4+/5 B shoulder ER (01/27/2019)    Time  6    Period  Weeks    Status  Achieved    Target Date  01/20/19            Plan - 01/27/19 1257    Clinical Impression Statement  Pt demonstrates improved B shoulder pain, AROM and function since initial evaluation. He also demonstrates independence and compliance with his HEP. Pt will benefit from continued skilled PT services per pt request to make sure he continues to progress in the right direction (improve strength ROM, function and decrease pain).    Personal Factors and Comorbidities  Age    Stability/Clinical Decision Making  Stable/Uncomplicated    Clinical Decision Making  Low    Rehab Potential  Good    PT Frequency  Biweekly   once every 2 weeks   PT Duration  6 weeks    PT Treatment/Interventions  Electrical Stimulation;Iontophoresis 48m/ml Dexamethasone;Therapeutic activities;Therapeutic exercise;Neuromuscular re-education;Patient/family education;Manual techniques;Passive range of motion;Dry needling;Joint Manipulations;Spinal Manipulations    PT Next Visit Plan  scapular and ER muscle strengthening, glenohumeral control, manual techniques, modalities PRN    PT Home Exercise Plan  Medbridge Access Code: [email or web address]   Consulted and Agree with Plan of Care  Patient       Patient will benefit from skilled therapeutic intervention in order to improve the following deficits and impairments:  Decreased range of motion, Decreased strength, Hypomobility, Impaired UE functional use, Improper body mechanics, Postural dysfunction, Pain  Visit Diagnosis: Chronic right shoulder pain - Plan: PT plan of care cert/re-cert  Chronic left shoulder pain - Plan: PT plan of care cert/re-cert  Pain in right arm - Plan: PT plan of care cert/re-cert  Pain in left arm - Plan: PT plan of care cert/re-cert     Problem List There are no active problems to display for this patient.   MJoneen Boers PT, DPT   01/27/2019, 3:08 PM  [facility] [address] [place] [address]Phone: [phone]  Fax:  [phone] Name: AWilber Fini MRN: [id number] of Birth: 706/25/1933

## 2019-02-03 ENCOUNTER — Ambulatory Visit: Payer: Medicare Other

## 2019-02-03 ENCOUNTER — Telehealth: Payer: Self-pay

## 2019-02-03 NOTE — Telephone Encounter (Signed)
No show. Called patient pertaining to today's scheduled appointment and requested a return phone call since a change in schedule was discussed last session (1x/2weeks for 6 weeks) to update his schedule. Phone number provided [phone])

## 2019-02-10 ENCOUNTER — Ambulatory Visit: Payer: Medicare Other

## 2020-12-08 ENCOUNTER — Other Ambulatory Visit: Payer: Self-pay

## 2020-12-08 ENCOUNTER — Emergency Department
Admission: EM | Admit: 2020-12-08 | Discharge: 2020-12-09 | Disposition: A | Discharge status: Home / Self Care | Payer: 59 | Attending: Emergency Medicine | Admitting: Emergency Medicine

## 2020-12-08 ENCOUNTER — Emergency Department: Payer: 59

## 2020-12-08 DIAGNOSIS — Z20822 Contact with and (suspected) exposure to covid-19: Secondary | ICD-10-CM | POA: Diagnosis not present

## 2020-12-08 DIAGNOSIS — J029 Acute pharyngitis, unspecified: Secondary | ICD-10-CM | POA: Insufficient documentation

## 2020-12-08 DIAGNOSIS — I10 Essential (primary) hypertension: Secondary | ICD-10-CM | POA: Diagnosis not present

## 2020-12-08 DIAGNOSIS — R059 Cough, unspecified: Secondary | ICD-10-CM | POA: Diagnosis not present

## 2020-12-08 DIAGNOSIS — J449 Chronic obstructive pulmonary disease, unspecified: Secondary | ICD-10-CM | POA: Insufficient documentation

## 2020-12-08 DIAGNOSIS — Z85828 Personal history of other malignant neoplasm of skin: Secondary | ICD-10-CM | POA: Diagnosis not present

## 2020-12-08 DIAGNOSIS — R067 Sneezing: Secondary | ICD-10-CM | POA: Insufficient documentation

## 2020-12-08 DIAGNOSIS — B9689 Other specified bacterial agents as the cause of diseases classified elsewhere: Secondary | ICD-10-CM | POA: Insufficient documentation

## 2020-12-08 DIAGNOSIS — N3001 Acute cystitis with hematuria: Secondary | ICD-10-CM | POA: Insufficient documentation

## 2020-12-08 DIAGNOSIS — R509 Fever, unspecified: Secondary | ICD-10-CM | POA: Diagnosis present

## 2020-12-08 LAB — COMPREHENSIVE METABOLIC PANEL
ALT: 11 U/L (ref 0–44)
AST: 23 U/L (ref 15–41)
Albumin: 3.6 g/dL (ref 3.5–5.0)
Alkaline Phosphatase: 54 U/L (ref 38–126)
Anion gap: 10 (ref 5–15)
BUN: 19 mg/dL (ref 8–23)
CO2: 22 mmol/L (ref 22–32)
Calcium: 8.5 mg/dL — ABNORMAL LOW (ref 8.9–10.3)
Chloride: 103 mmol/L (ref 98–111)
Creatinine, Ser: 0.93 mg/dL (ref 0.61–1.24)
GFR, Estimated: >60 mL/min (ref >60)
Glucose, Bld: 193 mg/dL — ABNORMAL HIGH (ref 70–99)
Potassium: 3.3 mmol/L — ABNORMAL LOW (ref 3.5–5.1)
Sodium: 135 mmol/L (ref 135–145)
Total Bilirubin: 1.8 mg/dL — ABNORMAL HIGH (ref 0.3–1.2)
Total Protein: 6.8 g/dL (ref 6.5–8.1)

## 2020-12-08 LAB — URINALYSIS, COMPLETE (UACMP) WITH MICROSCOPIC
Bacteria, UA: NONE SEEN
Bilirubin Urine: NEGATIVE
Glucose, UA: NEGATIVE mg/dL
Ketones, ur: 5 mg/dL — AB
Nitrite: POSITIVE — AB
Protein, ur: 30 mg/dL — AB
Specific Gravity, Urine: 1.018 (ref 1.005–1.030)
pH: 6 (ref 5.0–8.0)

## 2020-12-08 LAB — LIPASE, BLOOD: Lipase: 26 U/L (ref 11–51)

## 2020-12-08 LAB — CBC
HCT: 42 % (ref 39.0–52.0)
Hemoglobin: 14.9 g/dL (ref 13.0–17.0)
MCH: 32.8 pg (ref 26.0–34.0)
MCHC: 35.5 g/dL (ref 30.0–36.0)
MCV: 92.5 fL (ref 80.0–100.0)
Platelets: 156 10*3/uL (ref 150–400)
RBC: 4.54 MIL/uL (ref 4.22–5.81)
RDW: 12.6 % (ref 11.5–15.5)
WBC: 14 10*3/uL — ABNORMAL HIGH (ref 4.0–10.5)
nRBC: 0 % (ref 0.0–0.2)

## 2020-12-08 MED ORDER — SODIUM CHLORIDE 0.9 % IV SOLN
1.0000 g | Freq: Once | INTRAVENOUS | Status: AC
  Administered 2020-12-09: 1 g via INTRAVENOUS
  Filled 2020-12-08: qty 10

## 2020-12-08 NOTE — ED Triage Notes (Signed)
Pt presents via POV c/o abd cramps, nausea, and reflux. Reports fever and cough x3 days. Reports tmax 99ish. Reports cough and chest congestions. Ambulatory to triage with [facility]. Tongue noted to be black tinged, Reports taking pepto bismol around the clock for upset stomach.

## 2020-12-09 LAB — RESP PANEL BY RT-PCR (FLU A&B, COVID) ARPGX2
Influenza A by PCR: NEGATIVE
Influenza B by PCR: NEGATIVE
SARS Coronavirus 2 by RT PCR: NEGATIVE

## 2020-12-09 LAB — TROPONIN I (HIGH SENSITIVITY): Troponin I (High Sensitivity): 12 ng/L (ref <18)

## 2020-12-09 LAB — LACTIC ACID, PLASMA: Lactic Acid, Venous: 1.5 mmol/L (ref 0.5–1.9)

## 2020-12-09 MED ORDER — CEPHALEXIN 500 MG PO CAPS: 500.0000 mg | ORAL_CAPSULE | Freq: Three times a day (TID) | ORAL | 0 refills | Status: AC

## 2020-12-09 NOTE — Discharge Instructions (Addendum)
Take the antibiotics fully as prescribed. Follow up with your doctor in 3 days. Return to the ER if you have fever after 24 hours of antibiotics,  flank pain, abdominal pain, vomiting.

## 2020-12-09 NOTE — ED Provider Notes (Signed)
[facility] Emergency Department Provider Note  ____________________________________________  Time seen: Approximately 12:20 AM  I have reviewed the triage vital signs and the nursing notes.   HISTORY  Chief Complaint Abdominal Cramping and Sore Throat   HPI Evan Forbes. is a 87 y.o. male with past medical history of GERD, hyperlipidemia, COPD, hypertension, shingles, kidney stone presents for evaluation of a viral-like syndrome.  Patient reports 7 days of low-grade fever with a T-max of 100F, body aches, chills, sore throat, sneezing, and a dry cough.  No chest pain or shortness of breath.  He reports that he has been having a lot of reflux which she describes as feeling acid in the back of his throat this last week.  Has been taking a lot of Tums.  He is also complaining of discomfort in his suprapubic region mostly with urination.  He denies flank pain, nausea, vomiting, diarrhea, constipation.  Patient is vaccinated and booster against COVID.  PMH Arrhythmia   tachycardia  Hyperlipidemia      Anxiety      GERD (gastroesophageal reflux disease)      Actinic keratosis      Squamous Cell Carcinoma      COPD (chronic obstructive pulmonary disease) (HCC)      Colon polyp      Hypertension with goal to be determined      Kidney stones      Shingles      Basal cell carcinoma (BCC) of left preauricular region 09/06/2020     There are no problems to display for this patient.   Past Surgical History:  Procedure Laterality Date   APPENDECTOMY     KIDNEY STONE SURGERY     PROSTATE SURGERY      Prior to Admission medications   Medication Sig Start Date End Date Taking? Authorizing Provider  cephALEXin (KEFLEX) 500 MG capsule Take 1 capsule (500 mg total) by mouth 3 (three) times daily for 7 days. 12/09/20 12/16/20 Yes Rudene Re, MD    Allergies Patient has no known allergies.  No family history on file.  Social History    Review of  Systems  Constitutional: + fever, body aches Eyes: Negative for visual changes. ENT: + sore throat, congestion Neck: No neck pain  Cardiovascular: Negative for chest pain. Respiratory: Negative for shortness of breath. + cough Gastrointestinal: Negative for abdominal pain, vomiting or diarrhea. Genitourinary: Negative for dysuria. + suprapubic discomfort Musculoskeletal: Negative for back pain. Skin: Negative for rash. Neurological: Negative for headaches, weakness or numbness. Psych: No SI or HI  ____________________________________________   PHYSICAL EXAM:  VITAL SIGNS: ED Triage Vitals [12/08/20 1545]  Enc Vitals Group     BP 140/74     Pulse Rate (!) 59     Resp 16     Temp 99.2 F (37.3 C)     Temp Source Oral     SpO2 98 %     Weight      Height      Head Circumference      Peak Flow      Pain Score 2     Pain Loc      Pain Edu?      Excl. in [place]?     Constitutional: Alert and oriented. Well appearing and in no apparent distress. HEENT:      Head: Normocephalic and atraumatic.         Eyes: Conjunctivae are normal. Sclera is non-icteric.  Mouth/Throat: Mucous membranes are moist.       Neck: Supple with no signs of meningismus. Cardiovascular: Regular rate and rhythm. No murmurs, gallops, or rubs. 2+ symmetrical distal pulses are present in all extremities. No JVD. Respiratory: Normal respiratory effort. Lungs are clear to auscultation bilaterally.  Gastrointestinal: Soft, non tender, and non distended with positive bowel sounds. No rebound or guarding. Genitourinary: No CVA tenderness. Musculoskeletal:  No edema, cyanosis, or erythema of extremities. Neurologic: Normal speech and language. Face is symmetric. Moving all extremities. No gross focal neurologic deficits are appreciated. Skin: Skin is warm, dry and intact. No rash noted. Psychiatric: Mood and affect are normal. Speech and behavior are normal.  ____________________________________________    LABS (all labs ordered are listed, but only abnormal results are displayed)  Labs Reviewed  COMPREHENSIVE METABOLIC PANEL - Abnormal; Notable for the following components:      Result Value   Potassium 3.3 (*)    Glucose, Bld 193 (*)    Calcium 8.5 (*)    Total Bilirubin 1.8 (*)    All other components within normal limits  CBC - Abnormal; Notable for the following components:   WBC 14.0 (*)    All other components within normal limits  URINALYSIS, COMPLETE (UACMP) WITH MICROSCOPIC - Abnormal; Notable for the following components:   Color, Urine YELLOW (*)    APPearance HAZY (*)    Hgb urine dipstick SMALL (*)    Ketones, ur 5 (*)    Protein, ur 30 (*)    Nitrite POSITIVE (*)    Leukocytes,Ua SMALL (*)    All other components within normal limits  RESP PANEL BY RT-PCR (FLU A&B, COVID) ARPGX2  URINE CULTURE  CULTURE, BLOOD (SINGLE)  LIPASE, BLOOD  LACTIC ACID, PLASMA  TROPONIN I (HIGH SENSITIVITY)   ____________________________________________  EKG  ED ECG REPORT I, Rudene Re, the attending physician, personally viewed and interpreted this ECG.   Sinus rhythm with a rate of 77, RBBB, LAFB, no ST elevations or depressions, frequent PVCs.  No EKGs available for comparison however there is an EKG read by patient's cardiologist from 30 days ago which sounds the same as this 1. ____________________________________________  RADIOLOGY  I have personally reviewed the images performed during this visit and I agree with the Radiologist's read.   Interpretation by Radiologist:  DG Chest Port 1 View  Result Date: 12/08/2020 CLINICAL DATA:  Questionable sepsis.  Cough, chest congestion. EXAM: PORTABLE CHEST 1 VIEW.  Patient is rotated. COMPARISON:  None. FINDINGS: The heart size and mediastinal contours are within normal limits. Tortuous aorta. Atherosclerotic plaque. Biapical pleural/pulmonary scarring. No focal consolidation. No pulmonary edema. No pleural effusion. No  pneumothorax. No acute osseous abnormality. IMPRESSION: No active disease. Electronically Signed   By: Iven Finn M.D.   On: 12/08/2020 23:39     ____________________________________________   PROCEDURES  Procedure(s) performed:yes .1-3 Lead EKG Interpretation  Date/Time: 12/09/2020 12:24 AM Performed by: Rudene Re, MD Authorized by: Rudene Re, MD     Interpretation: abnormal     ECG rate assessment: normal     Rhythm: sinus rhythm     Ectopy: PVCs     Conduction: abnormal    Critical Care performed:  None ____________________________________________   INITIAL IMPRESSION / ASSESSMENT AND PLAN / ED COURSE  87 y.o. male with past medical history of GERD, hyperlipidemia, COPD, hypertension, shingles, kidney stone presents for evaluation of a viral-like syndrome x 7 days and suprapubic discomfort with urination.  Patient is extremely  well-appearing in no distress with normal work of breathing and normal sats both at rest and with ambulation, lungs are clear to auscultation, abdomen is soft and nontender.  EKG is unchanged from baseline.  Chest x-ray visualized by me with no signs of pneumonia or pulmonary edema.  Will swab patient for COVID and flu.  UA is positive for UTI with no signs of sepsis.  Patient has no tachycardia, no fever, but does have a white count of 14.  EMR reviewed with no available past positive urine cultures therefore will cover with Rocephin and send urine for culture.  Patient placed on telemetry for monitoring of cardiorespiratory status and to viral swab is back.   _________________________ 2:42 AM on 12/09/2020 ----------------------------------------- COVID and flu negative.  Patient remained extremely well-appearing no signs of sepsis while in the emergency room.  Plan to discharge home on 7-day course of Keflex with follow-up with PCP.  Discussed my standard return precautions    _____________________________________________ Please  note:  Patient was evaluated in Emergency Department today for the symptoms described in the history of present illness. Patient was evaluated in the context of the global COVID-19 pandemic, which necessitated consideration that the patient might be at risk for infection with the SARS-CoV-2 virus that causes COVID-19. Institutional protocols and algorithms that pertain to the evaluation of patients at risk for COVID-19 are in a state of rapid change based on information released by regulatory bodies including the CDC and federal and state organizations. These policies and algorithms were followed during the patient's care in the ED.  Some ED evaluations and interventions may be delayed as a result of limited staffing during the pandemic.   [place] Controlled Substance Database was reviewed by me. ____________________________________________   FINAL CLINICAL IMPRESSION(S) / ED DIAGNOSES   Final diagnoses:  Acute cystitis with hematuria      NEW MEDICATIONS STARTED DURING THIS VISIT:  ED Discharge Orders          Ordered    cephALEXin (KEFLEX) 500 MG capsule  3 times daily        12/09/20 0149             Note:  This document was prepared using Dragon voice recognition software and may include unintentional dictation errors.    Alfred Levins, [place], MD 12/09/20 [phone]

## 2020-12-10 LAB — URINE CULTURE: Special Requests: NORMAL

## 2020-12-14 LAB — CULTURE, BLOOD (SINGLE)
Culture: NO GROWTH
Special Requests: ADEQUATE

## 2021-11-20 IMAGING — DX DG CHEST 1V PORT
1 series · 1 of 1 positions shown · non-contrast
Comparison: None.

CLINICAL DATA: Questionable sepsis.  Cough, chest congestion.

EXAM:
PORTABLE CHEST 1 VIEW.  Patient is rotated.

[chest ap]
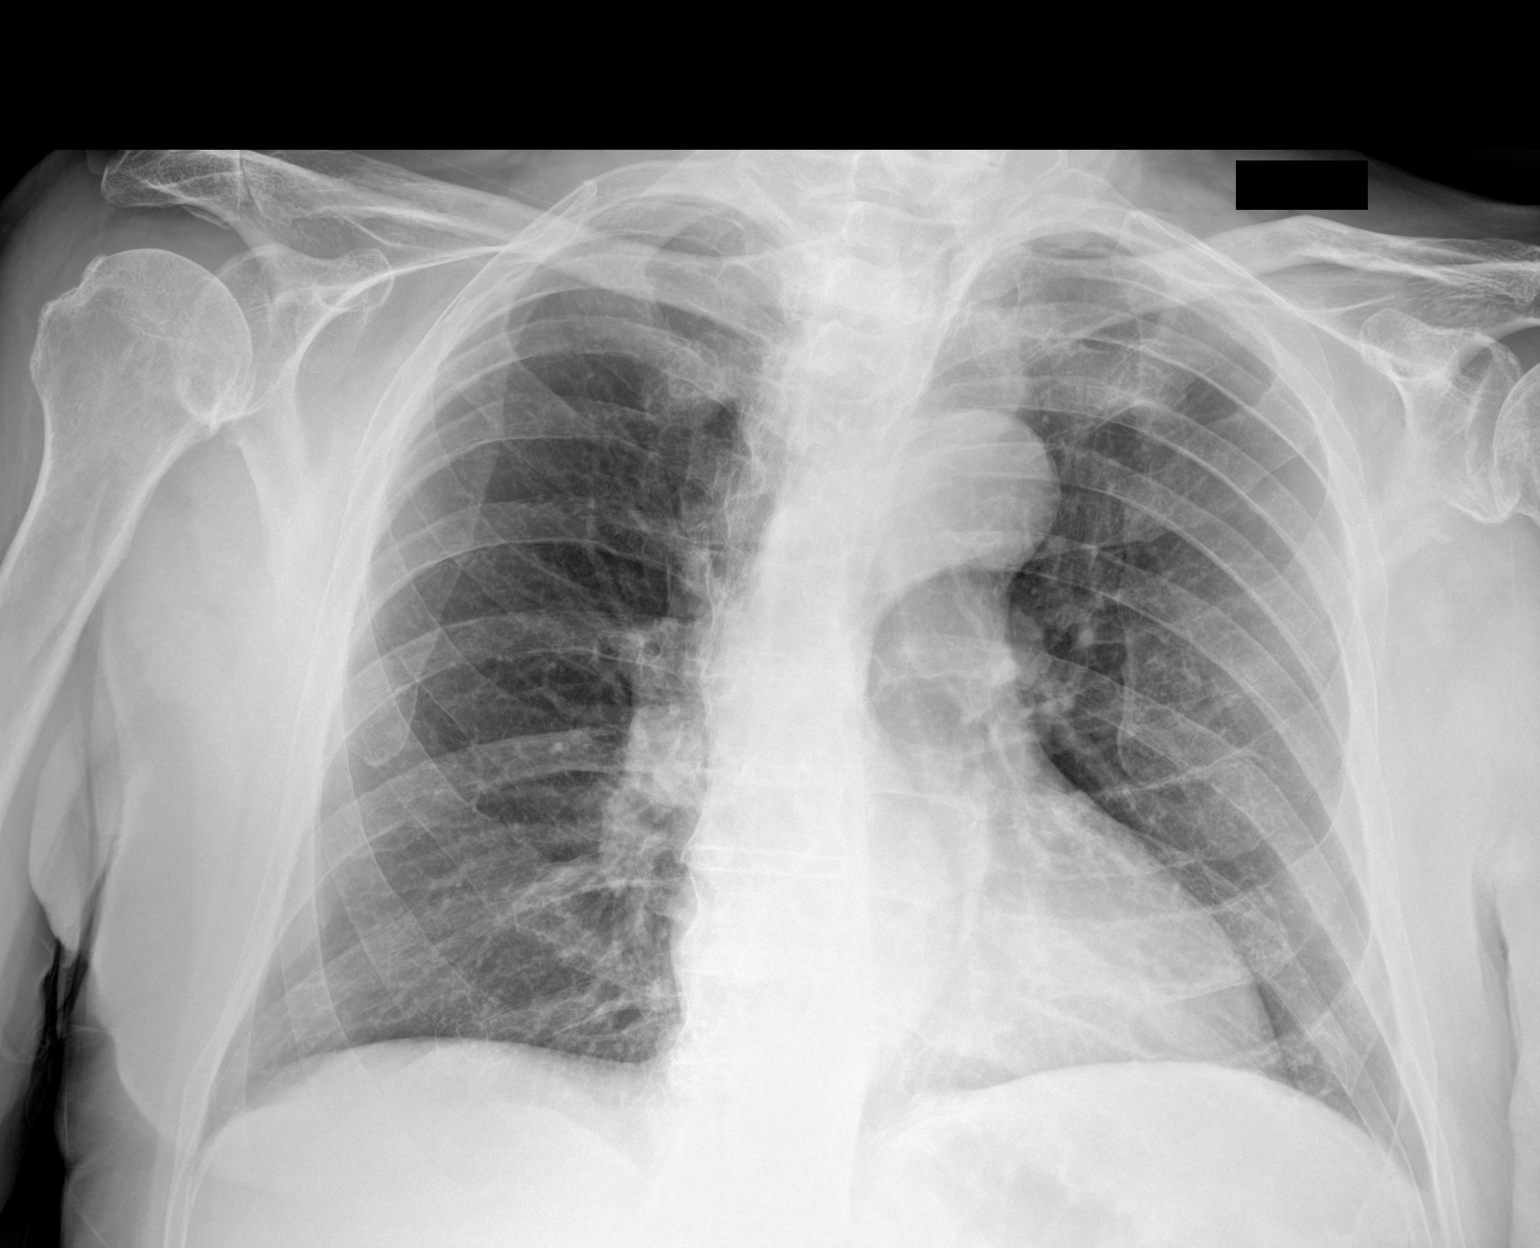

[1 of 1 positions shown; findings below may reference images not displayed]

FINDINGS: The heart size and mediastinal contours are within normal limits.
Tortuous aorta. Atherosclerotic plaque.

Biapical pleural/pulmonary scarring. No focal consolidation. No
pulmonary edema. No pleural effusion. No pneumothorax.

No acute osseous abnormality.
IMPRESSION: No active disease.

## 2024-03-29 ENCOUNTER — Ambulatory Visit: Attending: Neurology | Admitting: Physical Therapy

## 2024-03-29 DIAGNOSIS — R262 Difficulty in walking, not elsewhere classified: Secondary | ICD-10-CM | POA: Insufficient documentation

## 2024-03-29 DIAGNOSIS — G5793 Unspecified mononeuropathy of bilateral lower limbs: Secondary | ICD-10-CM | POA: Insufficient documentation

## 2024-03-29 DIAGNOSIS — R2689 Other abnormalities of gait and mobility: Secondary | ICD-10-CM | POA: Insufficient documentation

## 2024-03-29 DIAGNOSIS — R269 Unspecified abnormalities of gait and mobility: Secondary | ICD-10-CM | POA: Insufficient documentation

## 2024-03-30 ENCOUNTER — Ambulatory Visit: Admitting: Physical Therapy

## 2024-03-30 DIAGNOSIS — G5793 Unspecified mononeuropathy of bilateral lower limbs: Secondary | ICD-10-CM | POA: Diagnosis present

## 2024-03-30 DIAGNOSIS — R2689 Other abnormalities of gait and mobility: Secondary | ICD-10-CM

## 2024-03-30 DIAGNOSIS — R262 Difficulty in walking, not elsewhere classified: Secondary | ICD-10-CM | POA: Diagnosis present

## 2024-03-30 DIAGNOSIS — R269 Unspecified abnormalities of gait and mobility: Secondary | ICD-10-CM | POA: Diagnosis present

## 2024-03-30 NOTE — Therapy (Unsigned)
 OUTPATIENT PHYSICAL THERAPY NEURO EVALUATION   Patient Name: Evan Forbes. MRN: [id number] DOB:[birth year], 89 y.o., male 80 Date: 03/30/2024   PCP: Marsa Reyes Lenis, MD  REFERRING PROVIDER:   Maree Jannett POUR, MD    END OF SESSION:  PT End of Session - 03/30/24 1315     Visit Number 1    Number of Visits 24    Date for Recertification  06/22/24    PT Start Time 1315    PT Stop Time 1400    PT Time Calculation (min) 45 min    Equipment Utilized During Treatment Gait belt    Activity Tolerance Patient tolerated treatment well    Behavior During Therapy WFL for tasks assessed/performed          Past Medical History:  Diagnosis Date   Cancer (HCC)    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 States that it begin to notice neuropathy 5 years prior. Repots that it had progressed to above the knee. But now has improved to just below Bil knees. Sensation is better in the LLE compared to the R.   Pt accompanied by: self  PERTINENT HISTORY:  From prior MD appointement History of Present Illness Evan Forbes. is a 89 year old male with neuropathy who presents with neuropathy pain and balance issues. He  is transferring from [facility], Dr. Ter[facility] in [facility], for follow-up of neuropathy pain.  Peripheral neuropathy symptoms - Neuropathy pain present for five years, possibly related to prior lead exposure - Numbness involves bilateral feet and extends up to just above the knees - Some improvement in neuropathy symptoms with current treatment - Currently taking Cymbalta 30 mg for neuropathy - No prior use of alpha lipoic acid or magnesium for neuropathy  Gait instability  and balance impairment - Balance issues occur with sudden movements or changes in light or vision - Uses a cane for ambulation, especially when wearing thin shoes and moving slowly - No falls, but has experienced near-falls  Postherpetic neuralgia - Intermittent pain from prior shingles involving right posterior thigh and right testicles - Shingles pain began five years ago - Current pain is not severe  Lead exposure - 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 Yes: NPRS scale: 1/10 Pain location: posterior RLE   Pain description: ache/numbness  Aggravating factors:   Relieving factors:    PRECAUTIONS: Fall  RED FLAGS: None   WEIGHT BEARING RESTRICTIONS: No  FALLS: Has patient fallen in last 6 months? No and a few near falls but has had no full falls in >2 years   LIVING ENVIRONMENT: Lives with: lives alone Lives in: House/apartment Stairs: Yes: Internal: 18 steps; on right going up and on left going up and External: 2 steps; on left going up Has following equipment at home: Single point cane, Grab bars, and walking stick or hiking pole   PLOF: Independent, Independent with basic ADLs, Independent with household mobility with device,  Requires assistive device for independence, and walking stick   PATIENT GOALS:  Increased strength in BLE Improve balance and be able to walk in the dark   OBJECTIVE:  Note: Objective measures were completed at Evaluation unless otherwise noted.  DIAGNOSTIC FINDINGS: none relevant   COGNITION: Overall cognitive status: Within functional limits for tasks assessed   SENSATION: Light touch: Impaired  and reports increased lack of sensation distally but able to detect all but lateral aspect of the lower LLE    COORDINATION:  Mild sensory ataxia L>R   EDEMA:  none  MUSCLE TONE: WFL    POSTURE: rounded shoulders and forward head  LOWER EXTREMITY ROM:     Grossly WFL but slightly reduced hip extension   LOWER EXTREMITY MMT:    MMT Right Eval Left Eval  Hip flexion 4 4  Hip extension    Hip abduction 5 5  Hip adduction 4 4  Hip internal rotation    Hip external rotation    Knee flexion 4+ 4+  Knee extension 4+ 4+  Ankle dorsiflexion 5 5  Ankle plantarflexion    Ankle inversion    Ankle eversion    (Blank rows = not tested)  BED MOBILITY:  Not tested  TRANSFERS: Sit to stand: Complete Independence  Assistive device utilized: None     Stand to sit: Complete Independence  Assistive device utilized: None     Chair to chair: Modified independence  Assistive device utilized: None       RAMP:  Not tested  CURB:  Findings: cga   STAIRS: Findings: Level of Assistance: SBA, Stair Negotiation Technique: Alternating Pattern  with Bilateral Rails, Number of Stairs: 4, Height of Stairs: 6   , and Comments: sl GAIT: Findings: Gait Characteristics: Right foot flat, Left foot flat, trunk flexed, wide BOS, poor foot clearance- Right, and poor foot clearance- Left, Distance walked: 175ft, Assistive device utilized:hiking pole and none, Level of assistance: CGA, and Comments:    FUNCTIONAL TESTS:  5 times sit to stand: 14.09sec  Timed up  and go (TUG):  10 meter walk  test: 0.85 with hiking pole. 0.84 without AD  Berg Balance Scale:   [facility] PT Assessment - 03/31/24 0001       Berg Balance Test   Sit to Stand Able to stand without using hands and stabilize independently    Standing Unsupported Able to stand safely 2 minutes    Sitting with Back Unsupported but Feet Supported on Floor or Stool Able to sit safely and securely 2 minutes    Stand to Sit Sits safely with minimal use of hands    Transfers Able to transfer safely, minor use of hands    Standing Unsupported with Eyes Closed Able to stand 10 seconds with supervision    Standing Unsupported with Feet Together Able to place feet together independently but unable to hold for 30 seconds    From Standing, Reach Forward with Outstretched Arm Can reach confidently >25 cm (10)    From Standing Position, Pick up Object from Floor Able to pick up shoe, needs supervision    From Standing Position, Turn to Look Behind Over each Shoulder Turn sideways only but maintains balance    Turn 360 Degrees Able to turn 360 degrees safely but slowly    Standing Unsupported, Alternately Place Feet on Step/Stool Able to stand independently and complete 8 steps >20 seconds    Standing Unsupported, One Foot in Front Able to plae foot ahead of the other independently and hold 30 seconds    Standing on One Leg Tries to lift leg/unable to hold 3 seconds but remains standing independently    Total Score 43         Functional gait assessment:  FUNCTIONAL GAIT ASSESSMENT  TO BE COMPLETED Interpretation of scores: Non-Specific Older Adults Cutoff Score: <=22/30 = risk of falls Parkinson's Disease Cutoff score <15/30= fall risk (Hoehn & Yahr 1-4)  Minimally Clinically Important Difference (MCID)  Stroke (acute, subacute, and chronic) = MDC: 4.2 points Vestibular (acute) = MDC: 6 points Community Dwelling Older Adults =  MCID: 4 points Parkinson's Disease  =  MDC: 4.3 points  (Academy of Neurologic Physical Therapy (nd).  Functional Gait Assessment. Retrieved from https://www.neuropt.org/docs/default-source/cpgs/core-outcome-measures/function-gait-assessment-pocket-guide-proof9-(2).pdf?sfvrsn=b83f35043_0.)  PATIENT SURVEYS:  ABC scale: The Activities-Specific Balance Confidence (ABC) Scale 0% 10 20 30  40 50 60 70 80 90 100% No confidence<->completely confident  "How confident are you that you will not lose your balance or become unsteady when you . . .   Date tested   Walk around the house   2. Walk up or down stairs   3. Bend over and pick up a slipper from in front of a closet floor   4. Reach for a small can off a shelf at eye level   5. Stand on tip toes and reach for something above your head   6. Stand on a chair and reach for something   7. Sweep the floor   8. Walk outside the house to a car parked in the driveway   9. Get into or out of a car   10. Walk across a parking lot to the mall   11. Walk up or down a ramp   12. Walk in a crowded mall where people rapidly walk past you   13. Are bumped into by people as you walk through the mall   14. Step onto or off of an escalator while you are holding onto the railing   15. Step onto or off an escalator while  holding onto parcels such that you cannot hold onto the railing   16. Walk outside on icy sidewalks   Total: #/16                                                                                                                                  TREATMENT DATE: 03/30/24 PT evaluation   HEP instructed as listed below.    PATIENT EDUCATION: Education details: POC. HEP. Goals.  Person educated: Patient Education method: Medical illustrator Education comprehension: verbalized understanding and returned demonstration  HOME EXERCISE PROGRAM: Access Code: [email or web address] URL: [email or web address] Date: 03/31/2024 Prepared by: Massie Dollar  Exercises - Standing Tandem Balance with Counter Support  - 1 x daily - 3-4 x  weekly - 3 sets - 10 reps - 30 seconds  hold - Standing March with Counter Support  - 1 x daily - 3-4 x weekly - 3 sets - 10 reps - 2 seconds  hold - Standing Single Leg Stance with Counter Support  - 1 x daily - 3-4 x weekly - 3 sets - 10 reps - 5 seconds  hold - Sit to Stand with Armchair  - 1 x daily - 7 x weekly - 3 sets - 10 reps  GOALS: Goals reviewed with patient? Yes   SHORT TERM GOALS: Target date: 04/28/2024    Patient will be independent in home exercise program to improve strength/mobility for better functional independence with ADLs. Baseline: initiated 03/30/24 Goal status: INITIAL   LONG TERM GOALS: Target date: 06/23/2024    Patient will increase ABC scale score >80% to demonstrate better functional mobility and better confidence with ADLs.  Baseline: to be completed Goal status: INITIAL  2.  Patient (> 57 years old) will complete five times sit to stand test in <11 seconds indicating an increased LE strength and improved balance. Baseline: 14.09sec  Goal status: INITIAL  3.  Patient will increase Berg Balance score by > 6 points to demonstrate decreased fall risk during functional activities Baseline: 43 Goal status: INITIAL  4.  Patient will increase 10 meter walk test to >1.93m/s as to improve gait speed for better community ambulation and to reduce fall risk. Baseline:  0.84sec with walking stick Goal status: INITIAL  5.  Patient will reduce timed up and go to <11 seconds to reduce fall risk and demonstrate improved transfer/gait ability. Baseline: to be completed  Goal status: INITIAL  6.  Patient will increase FGA score to >22/30 as to demonstrate reduced fall risk and improved dynamic gait balance for better safety with community/home ambulation.   Baseline: to be completed  Goal status: INITIAL   ASSESSMENT:  CLINICAL IMPRESSION: Patient is a 89 y.o. male who was seen today for physical therapy evaluation and treatment for imbalance secondary  to lead poisoning. Pt demonstrates mild decrease in BLE strength as well as mild sensory deficits in BLE. Increased fall risk noted with reduced Berg balance  scale score <45, decreased gait speed to <1.54m/s, and  5x STS at 14 sec. Will complete FGA in next PT treatment to complete balance assessment as well as ABC to assess balance confidence with ADLs. Pt will benefit from skilled PT to address strength and balance deficits to reduce fall risk, and improve overall QoL   OBJECTIVE IMPAIRMENTS: Abnormal gait, cardiopulmonary status limiting activity, decreased activity tolerance, decreased balance, decreased coordination, decreased endurance, decreased knowledge of condition, decreased knowledge of use of DME, decreased mobility, difficulty walking, decreased ROM, decreased strength, decreased safety awareness, dizziness, impaired flexibility, impaired sensation, and pain.   ACTIVITY LIMITATIONS: carrying, lifting, bending, standing, squatting, stairs, transfers, locomotion level, and caring for others  PARTICIPATION LIMITATIONS: cleaning, laundry, driving, shopping, community activity, and yard work  PERSONAL FACTORS: Age, Behavior pattern, and 3+ comorbidities:   are also affecting patient's functional outcome.   REHAB POTENTIAL: Excellent  CLINICAL DECISION MAKING: Evolving/moderate complexity  EVALUATION COMPLEXITY: Moderate  PLAN:  PT FREQUENCY: 1-2x/week  PT DURATION: 12 weeks  PLANNED INTERVENTIONS: 97164- PT Re-evaluation, 97750- Physical Performance Testing, 97110-Therapeutic exercises, 97530- Therapeutic activity, 97112- Neuromuscular re-education, 97535- Self Care, [id number]- Manual therapy, [phone]- Gait training, Patient/Family education, Balance training, Stair training, Joint mobilization, Joint manipulation, Vestibular training, Visual/preceptual remediation/compensation, DME instructions, Cryotherapy, and Moist heat  PLAN FOR NEXT SESSION:  Complete FGA and ABC. Continue balance  training.    Massie FORBES Dollar, PT 03/30/2024, 1:16 PM

## 2024-03-31 ENCOUNTER — Ambulatory Visit: Admitting: Physical Therapy

## 2024-04-06 ENCOUNTER — Ambulatory Visit

## 2024-04-06 DIAGNOSIS — R262 Difficulty in walking, not elsewhere classified: Secondary | ICD-10-CM

## 2024-04-06 DIAGNOSIS — R2689 Other abnormalities of gait and mobility: Secondary | ICD-10-CM

## 2024-04-06 DIAGNOSIS — R269 Unspecified abnormalities of gait and mobility: Secondary | ICD-10-CM

## 2024-04-06 DIAGNOSIS — G5793 Unspecified mononeuropathy of bilateral lower limbs: Secondary | ICD-10-CM

## 2024-04-06 NOTE — Therapy (Signed)
 OUTPATIENT PHYSICAL THERAPY NEURO TREATMENT   Patient Name: Evan Forbes. 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  Pt notes that the STS are easy and is wondering if he can discontinue them.  He has however been utilized UE support rather than performing without UE assistance.  Pt encouraged to keep the exercise and attempt without the UE support.  Pt accompanied by: self  PERTINENT HISTORY:  From prior MD appointement History of Present Illness Evan Forbes. is a 89 year old male with neuropathy who presents with  neuropathy pain and balance issues. He is transferring from [facility], Dr. Teri in [facility], for follow-up of neuropathy pain.  Peripheral neuropathy symptoms - Neuropathy pain present for five years, possibly related to prior lead exposure - Numbness involves bilateral feet and extends up to just above the knees - Some improvement in neuropathy symptoms with current treatment - Currently taking Cymbalta 30 mg for neuropathy - No prior use of alpha lipoic acid or  magnesium for neuropathy  Gait instability and balance impairment - Balance issues occur with sudden movements or changes in light or vision - Uses a cane for ambulation, especially when wearing thin shoes and moving slowly - No falls, but has experienced near-falls  Postherpetic neuralgia - Intermittent pain from prior shingles involving right posterior thigh and right testicles - Shingles pain began five years ago - Current pain is not severe  Lead exposure - 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 Yes: NPRS scale: 1/10 Pain location: posterior RLE   Pain description: ache/numbness  Aggravating factors:   Relieving factors:    PRECAUTIONS: Fall  RED FLAGS: None   WEIGHT BEARING RESTRICTIONS: No  FALLS: Has patient fallen in last 6 months? No and a few near falls but has had no full falls in >2 years   LIVING ENVIRONMENT: Lives with: lives alone Lives in: House/apartment Stairs: Yes: Internal: 18 steps; on right going up and on left going up and External: 2 steps; on left going up Has following equipment at home: Single point cane, Grab bars, and walking stick or hiking pole   PLOF: Independent, Independent with basic ADLs,  Independent with household mobility with device, Requires assistive device for independence, and walking stick   PATIENT GOALS:  Increased strength in BLE Improve balance and be able to walk in the dark   OBJECTIVE:  Note: Objective measures were completed at Evaluation unless otherwise noted.  DIAGNOSTIC FINDINGS: none relevant   COGNITION: Overall cognitive status: Within functional limits for tasks assessed   SENSATION: Light touch: Impaired  and reports increased lack of sensation distally but able to detect all but lateral aspect of the lower LLE    COORDINATION:  Mild sensory ataxia L>R   EDEMA:  none  MUSCLE TONE: WFL    POSTURE: rounded shoulders and forward head  LOWER EXTREMITY ROM:     Grossly WFL but slightly reduced hip extension   LOWER EXTREMITY MMT:    MMT Right Eval Left Eval  Hip flexion 4 4  Hip extension    Hip abduction 5 5  Hip adduction 4 4  Hip internal rotation    Hip external rotation    Knee flexion 4+ 4+  Knee extension 4+ 4+  Ankle dorsiflexion 5 5  Ankle plantarflexion    Ankle inversion    Ankle eversion    (Blank rows = not tested)  BED MOBILITY:  Not tested  TRANSFERS: Sit to stand: Complete Independence  Assistive device utilized: None     Stand to sit: Complete Independence  Assistive device utilized: None     Chair to chair: Modified independence  Assistive device utilized: None       RAMP:  Not tested  CURB:  Findings: cga   STAIRS: Findings: Level of Assistance: SBA, Stair Negotiation Technique: Alternating Pattern  with Bilateral Rails, Number of Stairs: 4, Height of Stairs: 6   , and Comments: sl GAIT: Findings: Gait Characteristics: Right foot flat, Left foot flat, trunk flexed, wide BOS, poor foot clearance- Right, and poor foot clearance- Left, Distance walked: 123ft, Assistive device utilized:hiking pole and none, Level of assistance: CGA, and Comments:    FUNCTIONAL TESTS:  5 times sit  to stand:  14.09sec  Timed up and go (TUG):  10 meter walk test: 0.85 with hiking pole. 0.84 without AD  Functional gait assessment:  FUNCTIONAL GAIT ASSESSMENT  Date: 04/06/24 Score  GAIT LEVEL SURFACE Instructions: Walk at your normal speed from here to the next mark (6 m) [20 ft]. (2) Mild impairment - Walks 6 m (20 ft) in less than 7 seconds but greater than 5.5 seconds, uses assistive device, slower speed, mild gait deviations, or deviates 15.24 -25.4 cm (6 -10 in) outside of the 30.48-cm (12-in) walkway width.  2.   CHANGE IN GAIT SPEED Instructions: Begin walking at your normal pace (for 1.5 m [5 ft]). When I tell you "go," walk as fast as you can (for 1.5 m [5 ft]). When I tell you "slow," walk as slowly as you can (for 1.5 m [5 ft]. (2) Mild impairment - Is able to change speed but demonstrates mild gait deviations, deviates 15.24 -25.4 cm (6 -10 in) outside of the 30.48-cm (12-in) walkway width, or no gait deviations but unable to achieve a significant change in velocity, or uses an assistive device  3.    GAIT WITH HORIZONTAL HEAD TURNS Instructions: Walk from here to the next mark 6 m (20 ft) away. Begin walking at your normal pace. Keep walking straight; after 3 steps, turn your head to the right and keep walking straight while looking to the right. After 3 more steps, turn your head to the left and keep walking straight while looking left. Continue alternating looking right and left. (1) Moderate impairment - Performs head turns with moderate change in gait velocity, slows down, deviates 25.4 -38.1 cm (10 -15 in) outside 30.48-cm (12-in) walkway width but recovers, can continue to walk.  4.   GAIT WITH VERTICAL HEAD TURNS Instructions: Walk from here to the next mark (6 m [20 ft]). Begin walking at your normal pace. Keep walking straight; after 3 steps, tip your head up and keep walking straight while looking up. After 3 more steps, tip your head down, keep walking straight while looking down.  Continue  alternating looking up and down every 3 steps until you have completed 2 repetitions in each direction. (2) Mild impairment - Performs task with slight change in gait velocity (eg, minor disruption to smooth gait path), deviates 15.24 -25.4 cm (6 -10 in) outside 30.48-cm (12-in) walkway width or uses assistive device.  5.  GAIT AND PIVOT TURN Instructions: Begin with walking at your normal pace. When I tell you, "turn and stop," turn as quickly as you can to face the opposite direction and stop. (2) Mild impairment - Pivot turns safely in 3 seconds and stops with no loss of balance, or pivot turns safely within 3 seconds and stops with mild imbalance, requires small steps to catch balance  6.   STEP OVER OBSTACLE Instructions: Begin walking at your normal speed. When you come to the shoe box, step over it, not around it, and keep walking. (2) Mild impairment - Is able to step over one shoe box (11.43 cm [4.5 in] total height) without changing gait speed; no evidence of imbalance.  7.   GAIT WITH NARROW BASE OF SUPPORT Instructions: Walk on the floor with arms folded across the chest, feet aligned heel to toe in tandem for a distance of 3.6 m [12 ft]. The number of steps taken in a straight line are counted for a maximum of 10 steps. (0) Severe impairment - Ambulates less than 4  steps heel to toe or cannot perform without assistance.  8.   GAIT WITH EYES CLOSED Instructions: Walk at your normal speed from here to the next mark (6 m [20 ft]) with your eyes closed. (0) Severe impairment - Cannot walk 6 m (20 ft) without assistance, severe gait deviations or imbalance, deviates greater than 38.1 cm (15 in) outside 30.48-cm (12-in) walkway width or will not attempt task.  9.   AMBULATING BACKWARDS Instructions: Walk backwards until I tell you to stop (1) Moderate impairment - Walks 6 m (20 ft), slow speed, abnormal gait pattern, evidence for imbalance, deviates 25.4 -38.1 cm (10 -15 in) outside  30.48-cm (12-in) walkway width.  10. STEPS Instructions: Walk up these stairs as you would at home (ie, using the rail if necessary). At the top turn around and walk down. (2) Mild impairment-Alternating feet, must use rail.  Total 14/30   Interpretation of scores: Non-Specific Older Adults Cutoff Score: <=22/30 = risk of falls Parkinson's Disease Cutoff score <15/30= fall risk (Hoehn & Yahr 1-4)  Minimally Clinically Important Difference (MCID)  Stroke (acute, subacute, and chronic) = MDC: 4.2 points Vestibular (acute) = MDC: 6 points Community Dwelling Older Adults =  MCID: 4 points Parkinson's Disease  =  MDC: 4.3 points  (Academy of Neurologic Physical Therapy (nd). Functional Gait Assessment. Retrieved from https://www.neuropt.org/docs/default-source/cpgs/core-outcome-measures/function-gait-assessment-pocket-guide-proof9-(2).pdf?sfvrsn=b70f35043_0.)   PATIENT SURVEYS:  ABC scale: The Activities-Specific Balance Confidence (ABC) Scale 0% 10 20 30  40 50 60 70 80 90 100% No confidence<->completely confident  "How confident are you that you will not lose your balance or become unsteady when you . . .   Date tested 04/06/24  Walk around the house 90%  2. Walk up or down stairs 90%  3. Bend over and pick up a slipper from in front of a closet floor 80%  4. Reach for a small can off a shelf at eye level 100%  5. Stand on tip toes and reach for something above your head 80%  6. Stand on a chair and reach for something 20%  7. Sweep the floor 60%  8. Walk outside the house to a car parked in the driveway 100%  9. Get into or out of a car 100%  10. Walk across a parking lot to the mall 80%  11. Walk up or down a ramp 80%  12. Walk in a crowded mall where people rapidly walk past you 80%  13. Are bumped into by people as you walk through the mall 80%  14. Step onto or off of an escalator while you are holding onto the railing 90%  15. Step onto or off an escalator while holding onto  parcels such that you cannot hold onto the railing 70%  16. Walk outside on icy sidewalks 50%  Total: #/16  78.125%  TREATMENT DATE: 04/06/24  Physical Performance Testing  Activities-specific Balance Confidence Scale:  Score: 78.125% Increased risk of falls in community-dwelling, older adults <80% (79.89%)  0% = no confidence - 100% = complete confidence (ANPTA Core Set of Outcome Measures for Adults with Neurologic Conditions, 2018)  Pt participated in Functional Gait Assessment (FGA) with score of 14/30 demonstrating high fall risk (low fall risk 25-28, medium fall risk 19-24, and high fall risk <19).    Neuro:  Static stance on Airex pad, 30 sec bouts Static stance on Airex pad, eyes closed 30 sec bouts Static NBOS on Airex pad, 30 sec bouts Static NBOS on Airex pad, eyes closed, 30 sec bouts Static staggered stance on Airex pad, 30 sec bouts Static staggered stance on Airex pad, eyes closed, 30 sec bouts  Static stance on incline, EO, level 1, 30 sec bouts x2 Static stance on decline, EO, level 1, 30 sec bouts x2 Static stance on incline, EC, level 1, 30 sec bouts x2 Static stance on decline, EC, level 1, 30 sec bouts x2   TherAct:  Standing heel raises, 2x10 Standing toes raises, 2x10, inability to achieve full ROM due to lack of strength  Exercises performed to improve gait mechanics    PATIENT EDUCATION: Education details: POC. HEP. Goals.  Person educated: Patient Education method: Medical Illustrator Education comprehension: verbalized understanding and returned demonstration  HOME EXERCISE PROGRAM: Access Code: [email or web address] URL: [email or web address] Date: 03/31/2024 Prepared by: Massie Dollar  Exercises - Standing Tandem Balance with Counter Support  - 1 x daily - 3-4 x weekly - 3 sets - 10 reps - 30  seconds  hold - Standing March with Counter Support  - 1 x daily - 3-4 x weekly - 3 sets - 10 reps - 2 seconds  hold - Standing Single Leg Stance with Counter Support  - 1 x daily - 3-4 x weekly - 3 sets - 10 reps - 5 seconds  hold - Sit to Stand with Armchair  - 1 x daily - 7 x weekly - 3 sets - 10 reps  GOALS: Goals reviewed with patient? Yes   SHORT TERM GOALS: Target date: 04/28/2024    Patient will be independent in home exercise program to improve strength/mobility for better functional independence with ADLs. Baseline: initiated 03/30/24 Goal status: INITIAL   LONG TERM GOALS: Target date: 06/23/2024    Patient will increase ABC scale score >80% to demonstrate better functional mobility and better confidence with ADLs.  Baseline: to be completed Goal status: INITIAL  2.  Patient (> 34 years old) will complete five times sit to stand test in <11 seconds indicating an increased LE strength and improved balance. Baseline: 14.09sec  Goal status: INITIAL  3.  Patient will increase Berg Balance score by > 6 points to demonstrate decreased fall risk during functional activities Baseline: 43 Goal status: INITIAL  4.  Patient will increase 10 meter walk test to >1.80m/s as to improve gait speed for better community ambulation and to reduce fall risk. Baseline:  0.84sec with walking stick Goal status: INITIAL  5.  Patient will reduce timed up and go to <11 seconds to reduce fall risk and demonstrate improved transfer/gait ability. Baseline: to be completed  Goal status: INITIAL  6.  Patient will increase FGA score to >22/30 as to demonstrate reduced fall risk and improved dynamic gait balance for better safety with community/home ambulation.   Baseline: to be completed  Goal status: INITIAL   ASSESSMENT:  CLINICAL  IMPRESSION:  Pt performed well with the tasks given, however noted to be weaker with ankle dorsiflexion during ambulation and was given that as a task during  treatment along with adding it to his HEP in either sitting or standing.  Pt also challenged with balance activities and noted to have significantly reduced balance when eyesight is removed.  Pt will continue to benefit from continued balance training and higher level balance activities.   Pt will continue to benefit from skilled therapy to address remaining deficits in order to improve overall QoL and return to PLOF.     OBJECTIVE IMPAIRMENTS: Abnormal gait, cardiopulmonary status limiting activity, decreased activity tolerance, decreased balance, decreased coordination, decreased endurance, decreased knowledge of condition, decreased knowledge of use of DME, decreased mobility, difficulty walking, decreased ROM, decreased strength, decreased safety awareness, dizziness, impaired flexibility, impaired sensation, and pain.   ACTIVITY LIMITATIONS: carrying, lifting, bending, standing, squatting, stairs, transfers, locomotion level, and caring for others  PARTICIPATION LIMITATIONS: cleaning, laundry, driving, shopping, community activity, and yard work  PERSONAL FACTORS: Age, Behavior pattern, and 3+ comorbidities:   are also affecting patient's functional outcome.   REHAB POTENTIAL: Excellent  CLINICAL DECISION MAKING: Evolving/moderate complexity  EVALUATION COMPLEXITY: Moderate  PLAN:  PT FREQUENCY: 1-2x/week  PT DURATION: 12 weeks  PLANNED INTERVENTIONS: [id number]- PT Re-evaluation, 97750- Physical Performance Testing, 97110-Therapeutic exercises, 97530- Therapeutic activity, 97112- Neuromuscular re-education, 97535- Self Care, [id number]- Manual therapy, [phone]- Gait training, Patient/Family education, Balance training, Stair training, Joint mobilization, Joint manipulation, Vestibular training, Visual/preceptual remediation/compensation, DME instructions, Cryotherapy, and Moist heat  PLAN FOR NEXT SESSION:   Continue balance training and add in higher level items that challenge ankle strategies.      Fonda Simpers, PT, DPT Physical Therapist - [facility]  04/06/24, 5:28 PM

## 2024-04-12 ENCOUNTER — Ambulatory Visit: Attending: Neurology

## 2024-04-12 ENCOUNTER — Ambulatory Visit

## 2024-04-12 DIAGNOSIS — R269 Unspecified abnormalities of gait and mobility: Secondary | ICD-10-CM | POA: Insufficient documentation

## 2024-04-12 DIAGNOSIS — R262 Difficulty in walking, not elsewhere classified: Secondary | ICD-10-CM | POA: Insufficient documentation

## 2024-04-12 DIAGNOSIS — G5793 Unspecified mononeuropathy of bilateral lower limbs: Secondary | ICD-10-CM | POA: Diagnosis present

## 2024-04-12 DIAGNOSIS — R2689 Other abnormalities of gait and mobility: Secondary | ICD-10-CM | POA: Insufficient documentation

## 2024-04-12 NOTE — Therapy (Signed)
 OUTPATIENT PHYSICAL THERAPY NEURO TREATMENT   Patient Name: Evan Forbes. 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  Pt accompanied by: self  PERTINENT HISTORY:  From prior MD appointement History of Present Illness Art H Rosensteel Mickey. is a 89 year old male with neuropathy who presents with neuropathy pain and balance issues. He is transferring from [facility], Dr. Teri in [facility], for follow-up of neuropathy pain.  Peripheral neuropathy symptoms - Neuropathy pain present for five years, possibly  related to prior lead exposure - Numbness involves bilateral feet and extends up to just above the knees - Some improvement in neuropathy symptoms with current treatment - Currently taking Cymbalta 30 mg for neuropathy - No prior use of alpha lipoic acid or magnesium for neuropathy  Gait instability and balance impairment - Balance issues occur with sudden movements or changes in light or vision - Uses a cane for ambulation, especially when wearing thin shoes and moving slowly - No falls, but  has experienced near-falls  Postherpetic neuralgia - Intermittent pain from prior shingles involving right posterior thigh and right testicles - Shingles pain began five years ago - Current pain is not severe  Lead exposure - 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 Yes: NPRS scale: 1/10 Pain location: posterior RLE   Pain description: ache/numbness  Aggravating factors:   Relieving factors:    PRECAUTIONS: Fall  RED FLAGS: None   WEIGHT BEARING RESTRICTIONS: No  FALLS: Has patient fallen in last 6 months? No and a few near falls but has had no full falls in >2 years   LIVING ENVIRONMENT: Lives with: lives alone Lives in: House/apartment Stairs: Yes: Internal: 18 steps; on right going up and on left going up and External: 2 steps; on left going up Has following equipment at home: Single point cane, Grab bars, and walking stick or hiking pole   PLOF: Independent, Independent with basic ADLs, Independent with household mobility with device, Requires assistive device for independence, and walking stick   PATIENT GOALS:  Increased strength in BLE Improve balance and be able to walk in the dark   OBJECTIVE:  Note:  Objective measures were completed at Evaluation unless otherwise noted.  DIAGNOSTIC FINDINGS: none relevant   COGNITION: Overall cognitive status: Within functional limits for tasks assessed   SENSATION: Light touch: Impaired  and reports increased lack of sensation distally but able to detect all but lateral aspect of the lower LLE    COORDINATION:  Mild sensory ataxia L>R   EDEMA:  none  MUSCLE TONE: WFL    POSTURE: rounded shoulders and forward head  LOWER EXTREMITY ROM:     Grossly WFL but slightly reduced hip extension   LOWER EXTREMITY MMT:    MMT Right Eval Left Eval  Hip flexion 4 4  Hip extension    Hip abduction 5 5  Hip adduction 4 4  Hip internal rotation    Hip external rotation    Knee flexion 4+ 4+  Knee extension 4+ 4+  Ankle dorsiflexion 5 5  Ankle plantarflexion    Ankle inversion    Ankle eversion    (Blank rows = not tested)  BED MOBILITY:  Not tested  TRANSFERS: Sit to stand: Complete Independence  Assistive device utilized: None     Stand to sit: Complete Independence  Assistive device utilized: None     Chair to chair: Modified independence  Assistive device utilized: None       RAMP:  Not tested  CURB:  Findings: cga   STAIRS: Findings: Level of Assistance: SBA, Stair Negotiation Technique: Alternating Pattern  with Bilateral Rails, Number of Stairs: 4, Height of Stairs: 6   , and Comments: sl GAIT: Findings: Gait Characteristics: Right foot flat, Left foot flat, trunk flexed, wide BOS, poor foot clearance- Right, and poor foot clearance- Left, Distance walked: 132ft, Assistive device utilized:hiking pole and none, Level of assistance: CGA, and Comments:    FUNCTIONAL TESTS:  5 times sit to stand: 14.09sec  Timed up and go (TUG):  10 meter walk test: 0.85 with hiking pole. 0.84 without AD  Functional gait assessment:  FUNCTIONAL GAIT ASSESSMENT  Date: 04/06/24 Score  GAIT LEVEL SURFACE Instructions: Walk at  your normal  speed from here to the next mark (6 m) [20 ft]. (2) Mild impairment - Walks 6 m (20 ft) in less than 7 seconds but greater than 5.5 seconds, uses assistive device, slower speed, mild gait deviations, or deviates 15.24 -25.4 cm (6 -10 in) outside of the 30.48-cm (12-in) walkway width.  2.   CHANGE IN GAIT SPEED Instructions: Begin walking at your normal pace (for 1.5 m [5 ft]). When I tell you "go," walk as fast as you can (for 1.5 m [5 ft]). When I tell you "slow," walk as slowly as you can (for 1.5 m [5 ft]. (2) Mild impairment - Is able to change speed but demonstrates mild gait deviations, deviates 15.24 -25.4 cm (6 -10 in) outside of the 30.48-cm (12-in) walkway width, or no gait deviations but unable to achieve a significant change in velocity, or uses an assistive device  3.    GAIT WITH HORIZONTAL HEAD TURNS Instructions: Walk from here to the next mark 6 m (20 ft) away. Begin walking at your normal pace. Keep walking straight; after 3 steps, turn your head to the right and keep walking straight while looking to the right. After 3 more steps, turn your head to the left and keep walking straight while looking left. Continue alternating looking right and left. (1) Moderate impairment - Performs head turns with moderate change in gait velocity, slows down, deviates 25.4 -38.1 cm (10 -15 in) outside 30.48-cm (12-in) walkway width but recovers, can continue to walk.  4.   GAIT WITH VERTICAL HEAD TURNS Instructions: Walk from here to the next mark (6 m [20 ft]). Begin walking at your normal pace. Keep walking straight; after 3 steps, tip your head up and keep walking straight while looking up. After 3 more steps, tip your head down, keep walking straight while looking down. Continue  alternating looking up and down every 3 steps until you have completed 2 repetitions in each direction. (2) Mild impairment - Performs task with slight change in gait velocity (eg, minor disruption to smooth gait path), deviates  15.24 -25.4 cm (6 -10 in) outside 30.48-cm (12-in) walkway width or uses assistive device.  5.  GAIT AND PIVOT TURN Instructions: Begin with walking at your normal pace. When I tell you, "turn and stop," turn as quickly as you can to face the opposite direction and stop. (2) Mild impairment - Pivot turns safely in 3 seconds and stops with no loss of balance, or pivot turns safely within 3 seconds and stops with mild imbalance, requires small steps to catch balance  6.   STEP OVER OBSTACLE Instructions: Begin walking at your normal speed. When you come to the shoe box, step over it, not around it, and keep walking. (2) Mild impairment - Is able to step over one shoe box (11.43 cm [4.5 in] total height) without changing gait speed; no evidence of imbalance.  7.   GAIT WITH NARROW BASE OF SUPPORT Instructions: Walk on the floor with arms folded across the chest, feet aligned heel to toe in tandem for a distance of 3.6 m [12 ft]. The number of steps taken in a straight line are counted for a maximum of 10 steps. (0) Severe impairment - Ambulates less than 4 steps heel to toe or cannot perform without assistance.  8.   GAIT WITH EYES CLOSED Instructions: Walk at your normal speed from here to the next mark (6 m [20 ft]) with your eyes closed. (0) Severe impairment -  Cannot walk 6 m (20 ft) without assistance, severe gait deviations or imbalance, deviates greater than 38.1 cm (15 in) outside 30.48-cm (12-in) walkway width or will not attempt task.  9.   AMBULATING BACKWARDS Instructions: Walk backwards until I tell you to stop (1) Moderate impairment - Walks 6 m (20 ft), slow speed, abnormal gait pattern, evidence for imbalance, deviates 25.4 -38.1 cm (10 -15 in) outside 30.48-cm (12-in) walkway width.  10. STEPS Instructions: Walk up these stairs as you would at home (ie, using the rail if necessary). At the top turn around and walk down. (2) Mild impairment-Alternating feet, must use rail.  Total 14/30    Interpretation of scores: Non-Specific Older Adults Cutoff Score: <=22/30 = risk of falls Parkinson's Disease Cutoff score <15/30= fall risk (Hoehn & Yahr 1-4)  Minimally Clinically Important Difference (MCID)  Stroke (acute, subacute, and chronic) = MDC: 4.2 points Vestibular (acute) = MDC: 6 points Community Dwelling Older Adults =  MCID: 4 points Parkinson's Disease  =  MDC: 4.3 points  (Academy of Neurologic Physical Therapy (nd). Functional Gait Assessment. Retrieved from https://www.neuropt.org/docs/default-source/cpgs/core-outcome-measures/function-gait-assessment-pocket-guide-proof9-(2).pdf?sfvrsn=b16f35043_0.)   PATIENT SURVEYS:  ABC scale: The Activities-Specific Balance Confidence (ABC) Scale 0% 10 20 30  40 50 60 70 80 90 100% No confidence<->completely confident  "How confident are you that you will not lose your balance or become unsteady when you . . .   Date tested 04/06/24  Walk around the house 90%  2. Walk up or down stairs 90%  3. Bend over and pick up a slipper from in front of a closet floor 80%  4. Reach for a small can off a shelf at eye level 100%  5. Stand on tip toes and reach for something above your head 80%  6. Stand on a chair and reach for something 20%  7. Sweep the floor 60%  8. Walk outside the house to a car parked in the driveway 100%  9. Get into or out of a car 100%  10. Walk across a parking lot to the mall 80%  11. Walk up or down a ramp 80%  12. Walk in a crowded mall where people rapidly walk past you 80%  13. Are bumped into by people as you walk through the mall 80%  14. Step onto or off of an escalator while you are holding onto the railing 90%  15. Step onto or off an escalator while holding onto parcels such that you cannot hold onto the railing 70%  16. Walk outside on icy sidewalks 50%  Total: #/16  78.125%                                                                                                                                  TREATMENT DATE: 04/12/24   TherAct:  Standing heel raises, 2x10 Standing toe raises, 2x10, inability to achieve full ROM due to lack of strength  Exercises performed to improve  gait mechanics   Gait:  Patient ambulated 20 min indoor/outdoor today- including tile, concrete, carpet, horizontal head turns, on varied surfaces: concrete, brick, asphalt, pine needles, mulch, grass, up ramp, inclines, declines- with 0 seated rest break(s) - CGA for all indoor/outdoor - 1 instance of instability when ambulating over grass - Focusing on overall endurance and to improve ambulation across uneven terrain that pt would come in contact with in the community.   Neuro:  Static staggered stance with trailing foot on airex pad, leading foot placed on 4 step with airex pad on top, 60 sec bouts each LE leading  Static staggered stance with trailing foot on airex pad, leading foot placed on 4 step with airex pad on top, 60 sec bouts each LE leading with eyes closed, intermittent UE use to balance to prevent fall  Activity #1: Activity Description: 5 pods placed in front of pt on the ground, on 4 step, and airex pad, with pt only tapping on the blue pods with the R LE. Activity Setting:  Focus Number of Pods:  5 Cycles/Sets:  1 cycle Duration (Time or Hit Count):  30 hits  Activity #2: Activity Description: 5 pods placed in front of pt on the ground, on 4 step, and airex pad, with pt only tapping on the blue pods with the L LE. Activity Setting:  Focus Number of Pods:  5 Cycles/Sets:  1 cycle Duration (Time or Hit Count):  30 hits    PATIENT EDUCATION: Education details: POC. HEP. Goals.  Person educated: Patient Education method: Medical Illustrator Education comprehension: verbalized understanding and returned demonstration  HOME EXERCISE PROGRAM: Access Code: [email or web address] URL: [email or web address] Date: 03/31/2024 Prepared by: Massie Dollar  Exercises -  Standing Tandem Balance with Counter Support  - 1 x daily - 3-4 x weekly - 3 sets - 10 reps - 30 seconds  hold - Standing March with Counter Support  - 1 x daily - 3-4 x weekly - 3 sets - 10 reps - 2 seconds  hold - Standing Single Leg Stance with Counter Support  - 1 x daily - 3-4 x weekly - 3 sets - 10 reps - 5 seconds  hold - Sit to Stand with Armchair  - 1 x daily - 7 x weekly - 3 sets - 10 reps  GOALS: Goals reviewed with patient? Yes   SHORT TERM GOALS: Target date: 04/28/2024    Patient will be independent in home exercise program to improve strength/mobility for better functional independence with ADLs. Baseline: initiated 03/30/24 Goal status: INITIAL   LONG TERM GOALS: Target date: 06/23/2024    Patient will increase ABC scale score >80% to demonstrate better functional mobility and better confidence with ADLs.  Baseline: to be completed Goal status: INITIAL  2.  Patient (> 94 years old) will complete five times sit to stand test in <11 seconds indicating an increased LE strength and improved balance. Baseline: 14.09sec  Goal status: INITIAL  3.  Patient will increase Berg Balance score by > 6 points to demonstrate decreased fall risk during functional activities Baseline: 43 Goal status: INITIAL  4.  Patient will increase 10 meter walk test to >1.81m/s as to improve gait speed for better community ambulation and to reduce fall risk. Baseline:  0.84sec with walking stick Goal status: INITIAL  5.  Patient will reduce timed up and go to <11 seconds to reduce fall risk and demonstrate improved transfer/gait ability. Baseline: to be completed  Goal status: INITIAL  6.  Patient will increase FGA score to >22/30 as to demonstrate reduced fall risk and improved dynamic gait balance for better safety with community/home ambulation.   Baseline: to be completed  Goal status: INITIAL   ASSESSMENT:  CLINICAL IMPRESSION:  Pt responded well to the exercises and  demonstrated improved overall balance with the tasks given.  Pt demonstrates compliance with HEP with improved gait pattern specifically with heel-to-toe gait pattern.  Pt noted to have some minor scuffing of the feet when ambulating up an incline and likely due to the reduced strength of the anterior tibialis.  Will continue to monitor this and pt was advised to practice this as part of his HEP.   Pt will continue to benefit from skilled therapy to address remaining deficits in order to improve overall QoL and return to PLOF.      OBJECTIVE IMPAIRMENTS: Abnormal gait, cardiopulmonary status limiting activity, decreased activity tolerance, decreased balance, decreased coordination, decreased endurance, decreased knowledge of condition, decreased knowledge of use of DME, decreased mobility, difficulty walking, decreased ROM, decreased strength, decreased safety awareness, dizziness, impaired flexibility, impaired sensation, and pain.   ACTIVITY LIMITATIONS: carrying, lifting, bending, standing, squatting, stairs, transfers, locomotion level, and caring for others  PARTICIPATION LIMITATIONS: cleaning, laundry, driving, shopping, community activity, and yard work  PERSONAL FACTORS: Age, Behavior pattern, and 3+ comorbidities:   are also affecting patient's functional outcome.   REHAB POTENTIAL: Excellent  CLINICAL DECISION MAKING: Evolving/moderate complexity  EVALUATION COMPLEXITY: Moderate  PLAN:  PT FREQUENCY: 1-2x/week  PT DURATION: 12 weeks  PLANNED INTERVENTIONS: 97164- PT Re-evaluation, 97750- Physical Performance Testing, 97110-Therapeutic exercises, 97530- Therapeutic activity, 97112- Neuromuscular re-education, 97535- Self Care, [id number]- Manual therapy, [phone]- Gait training, Patient/Family education, Balance training, Stair training, Joint mobilization, Joint manipulation, Vestibular training, Visual/preceptual remediation/compensation, DME instructions, Cryotherapy, and Moist  heat  PLAN FOR NEXT SESSION:   Continue balance training and add in higher level items that challenge ankle strategies.     Fonda Simpers, PT, DPT Physical Therapist - [facility]  04/12/24, 3:34 PM

## 2024-04-14 ENCOUNTER — Ambulatory Visit: Admitting: Physical Therapy

## 2024-04-14 DIAGNOSIS — G5793 Unspecified mononeuropathy of bilateral lower limbs: Secondary | ICD-10-CM

## 2024-04-14 DIAGNOSIS — R2689 Other abnormalities of gait and mobility: Secondary | ICD-10-CM

## 2024-04-14 DIAGNOSIS — R262 Difficulty in walking, not elsewhere classified: Secondary | ICD-10-CM

## 2024-04-14 DIAGNOSIS — R269 Unspecified abnormalities of gait and mobility: Secondary | ICD-10-CM

## 2024-04-14 NOTE — Therapy (Signed)
 OUTPATIENT PHYSICAL THERAPY NEURO TREATMENT   Patient Name: Edilson Vital. 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 However, he feels he has already started to make some progress.   Pt stating that he has started to carry his cane, only holding it just in case as a security blanket. Pt stating that he has been taking walks with a friend, who commented on noting a difference the pt's walking. Pt states that they are going about 1/2 mile route with a nice hill.   Of his home exercises, bringing his toes up is  the most difficult.   Pt feels like he is able to have more heel strike without much thought on his mechanics.   Pt accompanied by: self  PERTINENT HISTORY:  From prior MD appointement History of Present Illness Art H Beilfuss Mickey. is a 89 year old male with neuropathy who presents with neuropathy pain and balance issues. He is transferring from [facility], Dr. Teri in [place], for follow-up of neuropathy pain.  Peripheral neuropathy symptoms - Neuropathy pain present for five years, possibly related to prior lead exposure - Numbness involves bilateral feet and extends up to just above the knees - Some improvement in neuropathy symptoms with current treatment - Currently taking Cymbalta 30 mg for neuropathy - No prior use of alpha lipoic acid or magnesium for neuropathy  Gait instability and balance impairment - Balance issues occur with sudden movements or changes in light or vision - Uses a cane for ambulation, especially when wearing thin shoes and moving slowly - No falls, but has experienced near-falls  Postherpetic neuralgia - Intermittent pain from prior shingles involving right posterior thigh and right testicles - Shingles pain began five years ago - Current pain is not severe  Lead exposure - 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 Yes: NPRS scale: 1/10 Pain location: posterior RLE   Pain description: ache/numbness  Aggravating factors:   Relieving factors:    PRECAUTIONS: Fall  RED FLAGS: None   WEIGHT BEARING RESTRICTIONS: No  FALLS: Has patient fallen in last 6 months? No and a few near falls but has had no full falls in >2 years   LIVING ENVIRONMENT: Lives with: lives  alone Lives in: House/apartment Stairs: Yes: Internal: 18 steps; on right going up and on left going up and External: 2 steps; on left going up Has following equipment at home: Single point cane, Grab bars, and walking stick or hiking pole   PLOF: Independent, Independent with basic ADLs, Independent with household mobility with device, Requires assistive device for independence, and walking stick   PATIENT GOALS:  Increased strength in BLE Improve balance and be able to walk in the dark   OBJECTIVE:  Note: Objective measures were completed at Evaluation unless otherwise noted.  DIAGNOSTIC FINDINGS: none relevant   COGNITION: Overall cognitive status: Within functional limits for tasks assessed   SENSATION: Light touch: Impaired  and reports increased lack of sensation distally but able to detect all but lateral aspect of the lower LLE    COORDINATION:  Mild sensory ataxia L>R   EDEMA:  none  MUSCLE TONE: WFL    POSTURE: rounded shoulders and forward head  LOWER EXTREMITY ROM:     Grossly WFL but slightly reduced hip extension   LOWER EXTREMITY MMT:    MMT Right Eval Left Eval  Hip flexion 4 4  Hip extension    Hip abduction 5 5  Hip adduction 4 4  Hip internal rotation    Hip external rotation    Knee flexion 4+ 4+  Knee extension 4+ 4+  Ankle dorsiflexion 5 5  Ankle plantarflexion    Ankle inversion    Ankle eversion    (Blank rows = not tested)  BED MOBILITY:  Not tested  TRANSFERS: Sit to stand: Complete Independence  Assistive device utilized: None     Stand to sit: Complete Independence  Assistive device utilized: None     Chair to chair: Modified independence  Assistive device utilized: None       RAMP:  Not tested  CURB:  Findings: cga   STAIRS: Findings: Level of Assistance: SBA, Stair Negotiation Technique: Alternating Pattern  with Bilateral  Rails, Number of Stairs: 4, Height of Stairs: 6   , and Comments: sl GAIT: Findings: Gait  Characteristics: Right foot flat, Left foot flat, trunk flexed, wide BOS, poor foot clearance- Right, and poor foot clearance- Left, Distance walked: 189ft, Assistive device utilized:hiking pole and none, Level of assistance: CGA, and Comments:    FUNCTIONAL TESTS:  5 times sit to stand: 14.09sec  Timed up and go (TUG):  10 meter walk test: 0.85 with hiking pole. 0.84 without AD  Functional gait assessment:  FUNCTIONAL GAIT ASSESSMENT  Date: 04/06/24 Score  GAIT LEVEL SURFACE Instructions: Walk at your normal speed from here to the next mark (6 m) [20 ft]. (2) Mild impairment - Walks 6 m (20 ft) in less than 7 seconds but greater than 5.5 seconds, uses assistive device, slower speed, mild gait deviations, or deviates 15.24 -25.4 cm (6 -10 in) outside of the 30.48-cm (12-in) walkway width.  2.   CHANGE IN GAIT SPEED Instructions: Begin walking at your normal pace (for 1.5 m [5 ft]). When I tell you "go," walk as fast as you can (for 1.5 m [5 ft]). When I tell you "slow," walk as slowly as you can (for 1.5 m [5 ft]. (2) Mild impairment - Is able to change speed but demonstrates mild gait deviations, deviates 15.24 -25.4 cm (6 -10 in) outside of the 30.48-cm (12-in) walkway width, or no gait deviations but unable to achieve a significant change in velocity, or uses an assistive device  3.    GAIT WITH HORIZONTAL HEAD TURNS Instructions: Walk from here to the next mark 6 m (20 ft) away. Begin walking at your normal pace. Keep walking straight; after 3 steps, turn your head to the right and keep walking straight while looking to the right. After 3 more steps, turn your head to the left and keep walking straight while looking left. Continue alternating looking right and left. (1) Moderate impairment - Performs head turns with moderate change in gait velocity, slows down, deviates 25.4 -38.1 cm (10 -15 in) outside 30.48-cm (12-in) walkway width but recovers, can continue to walk.  4.   GAIT WITH VERTICAL  HEAD TURNS Instructions: Walk from here to the next mark (6 m [20 ft]). Begin walking at your normal pace. Keep walking straight; after 3 steps, tip your head up and keep walking straight while looking up. After 3 more steps, tip your head down, keep walking straight while looking down. Continue  alternating looking up and down every 3 steps until you have completed 2 repetitions in each direction. (2) Mild impairment - Performs task with slight change in gait velocity (eg, minor disruption to smooth gait path), deviates 15.24 -25.4 cm (6 -10 in) outside 30.48-cm (12-in) walkway width or uses assistive device.  5.  GAIT AND PIVOT TURN Instructions: Begin with walking at your normal pace. When I tell you, "turn and stop," turn as quickly as you can to face the opposite direction and stop. (2) Mild impairment - Pivot turns safely in 3 seconds and stops with no loss of balance, or pivot turns safely within 3 seconds and stops with mild imbalance, requires small steps to catch balance  6.   STEP OVER OBSTACLE Instructions: Begin walking at your normal speed. When you come to the shoe box, step over it, not around it, and keep walking. (2) Mild impairment - Is able to step over one shoe box (11.43 cm [4.5 in] total height) without changing gait speed; no evidence of  imbalance.  7.   GAIT WITH NARROW BASE OF SUPPORT Instructions: Walk on the floor with arms folded across the chest, feet aligned heel to toe in tandem for a distance of 3.6 m [12 ft]. The number of steps taken in a straight line are counted for a maximum of 10 steps. (0) Severe impairment - Ambulates less than 4 steps heel to toe or cannot perform without assistance.  8.   GAIT WITH EYES CLOSED Instructions: Walk at your normal speed from here to the next mark (6 m [20 ft]) with your eyes closed. (0) Severe impairment - Cannot walk 6 m (20 ft) without assistance, severe gait deviations or imbalance, deviates greater than 38.1 cm (15 in) outside  30.48-cm (12-in) walkway width or will not attempt task.  9.   AMBULATING BACKWARDS Instructions: Walk backwards until I tell you to stop (1) Moderate impairment - Walks 6 m (20 ft), slow speed, abnormal gait pattern, evidence for imbalance, deviates 25.4 -38.1 cm (10 -15 in) outside 30.48-cm (12-in) walkway width.  10. STEPS Instructions: Walk up these stairs as you would at home (ie, using the rail if necessary). At the top turn around and walk down. (2) Mild impairment-Alternating feet, must use rail.  Total 14/30   Interpretation of scores: Non-Specific Older Adults Cutoff Score: <=22/30 = risk of falls Parkinson's Disease Cutoff score <15/30= fall risk (Hoehn & Yahr 1-4)  Minimally Clinically Important Difference (MCID)  Stroke (acute, subacute, and chronic) = MDC: 4.2 points Vestibular (acute) = MDC: 6 points Community Dwelling Older Adults =  MCID: 4 points Parkinson's Disease  =  MDC: 4.3 points  (Academy of Neurologic Physical Therapy (nd). Functional Gait Assessment. Retrieved from https://www.neuropt.org/docs/default-source/cpgs/core-outcome-measures/function-gait-assessment-pocket-guide-proof9-(2).pdf?sfvrsn=b79f35043_0.)   PATIENT SURVEYS:  ABC scale: The Activities-Specific Balance Confidence (ABC) Scale 0% 10 20 30  40 50 60 70 80 90 100% No confidence<->completely confident  "How confident are you that you will not lose your balance or become unsteady when you . . .   Date tested 04/06/24  Walk around the house 90%  2. Walk up or down stairs 90%  3. Bend over and pick up a slipper from in front of a closet floor 80%  4. Reach for a small can off a shelf at eye level 100%  5. Stand on tip toes and reach for something above your head 80%  6. Stand on a chair and reach for something 20%  7. Sweep the floor 60%  8. Walk outside the house to a car parked in the driveway 100%  9. Get into or out of a car 100%  10. Walk across a parking lot to the mall 80%  11. Walk up or  down a ramp 80%  12. Walk in a crowded mall where people rapidly walk past you 80%  13. Are bumped into by people as you walk through the mall 80%  14. Step onto or off of an escalator while you are holding onto the railing 90%  15. Step onto or off an escalator while holding onto parcels such that you cannot hold onto the railing 70%  16. Walk outside on icy sidewalks 50%  Total: #/16  78.125%  TREATMENT DATE: 04/14/24   TherAct: 2x10 STS without UE support 2x10 seated toe raises; pt reporting potentially a minor improvement in ROM   Exercises performed to improve gait mechanics & promote functional strength   Gait:  Patient ambulated 20 min indoor/outdoor today- including tile, concrete, carpet, horizontal head turns, on varied surfaces: concrete, brick, asphalt, pine needles, mulch, grass, up ramp, inclines, declines- with 0 seated rest break(s) - CGA for all indoor/outdoor - 2 instances of instability when ambulating over mulch - Focusing on overall endurance and to improve ambulation across uneven terrain that pt would come in contact with in the community.   Neuro: 3x 30 sec normal BOS with eyes closed overground 2x30 sec normal BOS with eyes closed on airex Static staggered stance on even ground, 2x30 sec each foot leading Static staggered stance on airex, 2x30 sec each foot leading  With all static balance activities above, noted improvement in balance & ankle strategies with additional repetitions. Pt with greatest difficulty with eyes closed. CGA-minA provided, pt intermittently requiring minA for balance recovery for anterior LOB.     PATIENT EDUCATION: Education details: POC. HEP. Goals.  Person educated: Patient Education method: Medical Illustrator Education comprehension: verbalized understanding and returned  demonstration  HOME EXERCISE PROGRAM: Access Code: [email or web address] URL: [email or web address] Date: 03/31/2024 Prepared by: Massie Dollar  Exercises - Standing Tandem Balance with Counter Support  - 1 x daily - 3-4 x weekly - 3 sets - 10 reps - 30 seconds  hold - Standing March with Counter Support  - 1 x daily - 3-4 x weekly - 3 sets - 10 reps - 2 seconds  hold - Standing Single Leg Stance with Counter Support  - 1 x daily - 3-4 x weekly - 3 sets - 10 reps - 5 seconds  hold - Sit to Stand with Armchair  - 1 x daily - 7 x weekly - 3 sets - 10 reps  GOALS: Goals reviewed with patient? Yes   SHORT TERM GOALS: Target date: 04/28/2024    Patient will be independent in home exercise program to improve strength/mobility for better functional independence with ADLs. Baseline: initiated 03/30/24 Goal status: INITIAL   LONG TERM GOALS: Target date: 06/23/2024    Patient will increase ABC scale score >80% to demonstrate better functional mobility and better confidence with ADLs.  Baseline: to be completed Goal status: INITIAL  2.  Patient (> 45 years old) will complete five times sit to stand test in <11 seconds indicating an increased LE strength and improved balance. Baseline: 14.09sec  Goal status: INITIAL  3.  Patient will increase Berg Balance score by > 6 points to demonstrate decreased fall risk during functional activities Baseline: 43 Goal status: INITIAL  4.  Patient will increase 10 meter walk test to >1.6m/s as to improve gait speed for better community ambulation and to reduce fall risk. Baseline:  0.84sec with walking stick Goal status: INITIAL  5.  Patient will reduce timed up and go to <11 seconds to reduce fall risk and demonstrate improved transfer/gait ability. Baseline: to be completed  Goal status: INITIAL  6.  Patient will increase FGA score to >22/30 as to demonstrate reduced fall risk and improved dynamic gait balance for better safety with  community/home ambulation.   Baseline: to be completed  Goal status: INITIAL   ASSESSMENT:  CLINICAL IMPRESSION:  Pt responded well to the exercises and demonstrated improved overall balance with the tasks given.  Pt demonstrates compliance with HEP with  improved gait pattern specifically with heel-to-toe gait pattern, pt noting that he does not have to make as much of a conscious effort for heel-to-toe gait pattern.  Pt noted to have some minor scuffing of the feet when ambulating up an incline and likely due to the reduced strength of the anterior tibialis. Pt benefiting from education regarding balance systems & progress with therapy thus far.  Pt will continue to benefit from skilled therapy to address remaining deficits in order to improve overall QoL and return to PLOF.      OBJECTIVE IMPAIRMENTS: Abnormal gait, cardiopulmonary status limiting activity, decreased activity tolerance, decreased balance, decreased coordination, decreased endurance, decreased knowledge of condition, decreased knowledge of use of DME, decreased mobility, difficulty walking, decreased ROM, decreased strength, decreased safety awareness, dizziness, impaired flexibility, impaired sensation, and pain.   ACTIVITY LIMITATIONS: carrying, lifting, bending, standing, squatting, stairs, transfers, locomotion level, and caring for others  PARTICIPATION LIMITATIONS: cleaning, laundry, driving, shopping, community activity, and yard work  PERSONAL FACTORS: Age, Behavior pattern, and 3+ comorbidities:   are also affecting patient's functional outcome.   REHAB POTENTIAL: Excellent  CLINICAL DECISION MAKING: Evolving/moderate complexity  EVALUATION COMPLEXITY: Moderate  PLAN:  PT FREQUENCY: 1-2x/week  PT DURATION: 12 weeks  PLANNED INTERVENTIONS: 97164- PT Re-evaluation, 97750- Physical Performance Testing, 97110-Therapeutic exercises, 97530- Therapeutic activity, 97112- Neuromuscular re-education, 97535- Self  Care, [id number]- Manual therapy, [phone]- Gait training, Patient/Family education, Balance training, Stair training, Joint mobilization, Joint manipulation, Vestibular training, Visual/preceptual remediation/compensation, DME instructions, Cryotherapy, and Moist heat  PLAN FOR NEXT SESSION:   Continue balance training and add in higher level items that challenge ankle strategies.     Chiquita Silvan, SPT Physical Therapy Student - [place]  [facility]  04/14/24, 5:55 PM

## 2024-04-19 ENCOUNTER — Ambulatory Visit

## 2024-04-19 DIAGNOSIS — R262 Difficulty in walking, not elsewhere classified: Secondary | ICD-10-CM

## 2024-04-19 DIAGNOSIS — R2689 Other abnormalities of gait and mobility: Secondary | ICD-10-CM

## 2024-04-19 DIAGNOSIS — G5793 Unspecified mononeuropathy of bilateral lower limbs: Secondary | ICD-10-CM

## 2024-04-19 DIAGNOSIS — R269 Unspecified abnormalities of gait and mobility: Secondary | ICD-10-CM

## 2024-04-19 NOTE — Therapy (Signed)
 OUTPATIENT PHYSICAL THERAPY TREATMENT   Patient Name: Evan Forbes. MRN: [id number] DOB:30-Jul-1931, 89 y.o., male Today's Date: 04/19/2024   PCP: Marsa Reyes Lenis, MD  REFERRING PROVIDER:   Maree Jannett POUR, MD    END OF SESSION:   PT End of Session - 04/19/24 1448     Visit Number 5    Number of Visits 24    Date for Recertification  06/22/24    Authorization Type Medicare A & B; Generic A[employer] secondary    Progress Note Due on Visit 10    PT Start Time 1445    PT Stop Time 1525    PT Time Calculation (min) 40 min    Equipment Utilized During Treatment Gait belt    Activity Tolerance Patient tolerated treatment well    Behavior During Therapy WFL for tasks assessed/performed           Past Medical History:  Diagnosis Date   Cancer (HCC)    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 Has been working hard at his upright posture and his toe extension extercises, as well as eyes closed balance activities.   PERTINENT HISTORY:  89yoM referred to OPPT for unsteadiness, imbalance 2/2 neuropathy. PMH: Hypertension, Arrhythmia, GERD, Glaucoma, Chronic kidney disease with eGFR of 57,   History of B12 deficiency  PAIN:  Are you having pain?   PRECAUTIONS: Fall  WEIGHT BEARING RESTRICTIONS: No  FALLS: Has patient fallen in last 6 months? No and a few near falls but has had no full falls in >2 years   LIVING ENVIRONMENT: Lives with: lives alone Lives in: House/apartment Stairs: Yes: Internal: 18 steps; on right going up and on left going up and External: 2 steps; on left going up Has following equipment at home:  Single point cane, Grab bars, and walking stick or hiking pole   PLOF: Independent, Independent with basic ADLs, Independent with household mobility with device, Requires assistive device for independence, and walking stick   PATIENT GOALS:  Increased strength in BLE Improve balance and be able to walk in the dark   OBJECTIVE:  Note: Objective measures were completed at Evaluation unless otherwise noted.                                                                                                                            TREATMENT DATE: 04/19/24 -STS hands free x10  -standing double heel raise x20 (bar support) -standing double ankle DF x15 (bar support)  -STS hands free x10  -standing double heel raise x20 (bar support) -standing double ankle DF x15 (bar support)  -airex pad stance 2x 30secH  (5LOB)  -airex pad basketball toss/catch x15 (2LOB)  -airex pad basketball overhead rebounding x15 (3LOB)  -airex pad basketball toss/catch x15 (2LOB)  -airex pad basketball overhead rebounding x15 (3LOB) -2.5 foam fwd step up, fwd step down; 4 firm step up, 4 firm step down 180 degree turnaround: 12x, then 12x with 7lb weight carry  -AMB in hallway with alternat eyes open, eyes closed x588ft   PATIENT EDUCATION: Education details: There ar eno eyes closed activiites on his current HEP. Please stick to handout.   Person educated: Patient Education method: Medical Illustrator Education comprehension: verbalized understanding and returned  demonstration  HOME EXERCISE PROGRAM: Access Code: [email or web address] URL: [email or web address] Date: 03/31/2024 Prepared by: Massie Dollar  Exercises - Standing Tandem Balance with Counter Support  - 1 x daily - 3-4 x weekly - 3 sets - 10 reps - 30 seconds  hold - Standing March with Counter Support  - 1 x daily - 3-4 x weekly - 3 sets - 10 reps - 2 seconds  hold - Standing Single Leg Stance with Counter Support  - 1 x daily - 3-4 x weekly - 3 sets - 10 reps - 5 seconds  hold - Sit to Stand with Armchair  - 1 x daily - 7 x weekly - 3 sets - 10 reps  GOALS: Goals reviewed with patient? Yes  SHORT TERM GOALS: Target date: 04/28/2024 Patient will be independent in home exercise program to improve strength/mobility for better functional independence with ADLs. Baseline: initiated 03/30/24 Goal status: INITIAL  LONG TERM GOALS: Target date: 06/23/2024  Patient will increase ABC scale score >80% to demonstrate better functional mobility and better confidence with ADLs.  Baseline: to be completed Goal status: INITIAL  2.  Patient (> 60 years old) will complete five times sit to stand test in <11 seconds indicating an increased LE strength and improved balance. Baseline: 14.09sec  Goal status: INITIAL  3.  Patient will increase Berg Balance score by > 6 points to demonstrate decreased fall risk during functional activities Baseline: 43 Goal status: INITIAL  4.  Patient will increase 10 meter  walk test to >1.53m/s as to improve gait speed for better community ambulation and to reduce fall risk. Baseline:  0.84sec with walking stick Goal status: INITIAL  5.  Patient will reduce timed up and go to <11 seconds to reduce fall risk and demonstrate improved transfer/gait ability. Baseline: to be completed  Goal status: INITIAL  6.  Patient will increase FGA score to >22/30 as to demonstrate reduced fall risk and improved dynamic gait balance for better safety with community/home  ambulation.  Baseline: to be completed  Goal status: INITIAL   ASSESSMENT:  CLINICAL IMPRESSION: Continued to heafty proprioceptive motor control training, particularly in gait. Breaks provided. Pt remains very focused with interventions here and at home. Pt will continue to benefit from skilled therapy to address remaining deficits in order to improve overall QoL and return to PLOF.     OBJECTIVE IMPAIRMENTS: Abnormal gait, cardiopulmonary status limiting activity, decreased activity tolerance, decreased balance, decreased coordination, decreased endurance, decreased knowledge of condition, decreased knowledge of use of DME, decreased mobility, difficulty walking, decreased ROM, decreased strength, decreased safety awareness, dizziness, impaired flexibility, impaired sensation, and pain.   ACTIVITY LIMITATIONS: carrying, lifting, bending, standing, squatting, stairs, transfers, locomotion level, and caring for others  PARTICIPATION LIMITATIONS: cleaning, laundry, driving, shopping, community activity, and yard work  PERSONAL FACTORS: Age, Behavior pattern, and 3+ comorbidities:   are also affecting patient's functional outcome.   REHAB POTENTIAL: Excellent  CLINICAL DECISION MAKING: Evolving/moderate complexity  EVALUATION COMPLEXITY: Moderate  PLAN:  PT FREQUENCY: 1-2x/week  PT DURATION: 12 weeks  PLANNED INTERVENTIONS: 97164- PT Re-evaluation, 97750- Physical Performance Testing, 97110-Therapeutic exercises, 97530- Therapeutic activity, 97112- Neuromuscular re-education, 97535- Self Care, 02859- Manual therapy, [phone]- Gait training, Patient/Family education, Balance training, Stair training, Joint mobilization, Joint manipulation, Vestibular training, Visual/preceptual remediation/compensation, DME instructions, Cryotherapy, and Moist heat  PLAN FOR NEXT SESSION:  Continue balance training and add in higher level items that challenge ankle strategies.    3:19 PM,  04/19/24 Peggye JAYSON Linear, PT, DPT Physical Therapist - [place] [facility]  Outpatient Physical Therapy- [facility] [phone]

## 2024-04-21 ENCOUNTER — Ambulatory Visit: Admitting: Physical Therapy

## 2024-04-21 DIAGNOSIS — G5793 Unspecified mononeuropathy of bilateral lower limbs: Secondary | ICD-10-CM

## 2024-04-21 DIAGNOSIS — R2689 Other abnormalities of gait and mobility: Secondary | ICD-10-CM | POA: Diagnosis not present

## 2024-04-21 DIAGNOSIS — R269 Unspecified abnormalities of gait and mobility: Secondary | ICD-10-CM

## 2024-04-21 DIAGNOSIS — R262 Difficulty in walking, not elsewhere classified: Secondary | ICD-10-CM

## 2024-04-21 NOTE — Therapy (Signed)
 OUTPATIENT PHYSICAL THERAPY TREATMENT   Patient Name: Evan Forbes. MRN: [id number] DOB:1931-11-26, 89 y.o., male Today's Date: 04/21/2024   PCP: Marsa Reyes Lenis, MD  REFERRING PROVIDER:   Maree Jannett POUR, MD    END OF SESSION:   PT End of Session - 04/21/24 1427     Visit Number 6    Number of Visits 24    Date for Recertification  06/22/24    Authorization Type Medicare A & B; Generic A[employer] secondary    Progress Note Due on Visit 10    PT Start Time 1445    PT Stop Time 1525    PT Time Calculation (min) 40 min    Equipment Utilized During Treatment Gait belt    Activity Tolerance Patient tolerated treatment well    Behavior During Therapy WFL for tasks assessed/performed           Past Medical History:  Diagnosis Date   Cancer (HCC)    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 Pt also states that he has been able to maintain tandem stance better and with more confidence.  PERTINENT HISTORY:  89yoM referred to OPPT for unsteadiness, imbalance 2/2 neuropathy. PMH: Hypertension, Arrhythmia, GERD, Glaucoma, Chronic kidney disease with eGFR of 57,  History of B12  deficiency  PAIN:  Are you having pain?   PRECAUTIONS: Fall  WEIGHT BEARING RESTRICTIONS: No  FALLS: Has patient fallen in last 6 months? No and a few near falls but has had no full falls in >2 years   LIVING ENVIRONMENT: Lives with: lives alone Lives in: House/apartment Stairs: Yes: Internal: 18 steps; on right going up and on left going up and External: 2 steps; on left going up Has following equipment at home: Single point cane, Grab  bars, and walking stick or hiking pole   PLOF: Independent, Independent with basic ADLs, Independent with household mobility with device, Requires assistive device for independence, and walking stick   PATIENT GOALS:  Increased strength in BLE Improve balance and be able to walk in the dark   OBJECTIVE:  Note: Objective measures were completed at Evaluation unless otherwise noted.                                                                                                                            TREATMENT DATE: 04/21/24  TA- To improve functional movements patterns for everyday tasks    2 rounds: STS x 10 without UE support Alternating standing heel raises/ toe raises x 15 (UE support)  NMR: To facilitate reeducation of movement, balance, posture, coordination, and/or proprioception/kinesthetic sense.  Basketball toss: - from airex x 3 min  - squat from airex x 3 min  - Occasional LOB with addition of squat  Gait:   Fwd/ bwd gait with horizontal head turns naming letters x 1 Fwd gait with eyes closed, followed by bwd gait with horizontal head turns naming letters x 2 -Pt experienced min LOB throughout and decreased speed with dual task -Difficulty maintaining forward direction with EC  Step up: - Lateral step up to airex without UE support x 10 - Fwd step up onto without UE support x 10  -LOB with fwd step up  TE- To improve strength, endurance, mobility, and function of specific targeted muscle groups or improve joint range  of motion or improve muscle flexibility   Decline board: seated toe raises 2 x 15  - Verbal cue for slow lower  - Educated on purpose of decline to allow for more ROM  PATIENT EDUCATION: Education details: Pt educated on increased ROM with decline toe raises.    Person educated: Patient Education method: Medical Illustrator Education comprehension: verbalized understanding and returned demonstration  HOME EXERCISE PROGRAM: Access Code: [email or web address] URL: [email or web address] Date: 03/31/2024 Prepared by: Massie Dollar  Exercises - Standing Tandem Balance with Counter Support  - 1 x daily - 3-4 x weekly - 3 sets - 10 reps - 30 seconds  hold - Standing March with Counter Support  - 1 x daily - 3-4 x weekly - 3 sets - 10 reps - 2 seconds  hold - Standing Single Leg Stance with Counter Support  - 1 x daily - 3-4 x weekly - 3 sets - 10 reps - 5 seconds  hold - Sit to Stand with Armchair  - 1 x daily - 7 x weekly - 3 sets - 10 reps  GOALS: Goals reviewed with patient? Yes  SHORT TERM GOALS: Target date: 04/28/2024 Patient will be independent in home exercise program to improve strength/mobility for better functional independence with ADLs. Baseline: initiated 03/30/24 Goal status: INITIAL  LONG TERM GOALS: Target date: 06/23/2024  Patient will increase ABC scale score >80% to demonstrate better functional mobility and better  confidence with ADLs.  Baseline: to be completed Goal status: INITIAL  2.  Patient (> 84 years old) will complete five times sit to stand test in <11 seconds indicating an increased LE strength and improved balance. Baseline: 14.09sec  Goal status: INITIAL  3.  Patient will increase Berg Balance score by > 6 points to demonstrate decreased fall risk during functional activities Baseline: 43 Goal status: INITIAL  4.  Patient will increase 10 meter walk test to >1.43m/s as to improve gait speed for better community ambulation and to reduce  fall risk. Baseline:  0.84sec with walking stick Goal status: INITIAL  5.  Patient will reduce timed up and go to <11 seconds to reduce fall risk and demonstrate improved transfer/gait ability. Baseline: to be completed  Goal status: INITIAL  6.  Patient will increase FGA score to >22/30 as to demonstrate reduced fall risk and improved dynamic gait balance for better safety with community/home ambulation.  Baseline: to be completed  Goal status: INITIAL   ASSESSMENT:  CLINICAL IMPRESSION:  Pt arrived with good motivation for completion of PT activities. Today's session focused on dynamic gait and balance. Pt dicussed difficulty with toes raises, which were modified to seated decline and allowed for increased ROM. Pt demonstrated difficulty and occasional LOB with basketball toss, gait challenges, and fwd step ups onto airex. Pt notes throughout the session that his confidence has improved since the initial eval. Pt also requested reasoning of exercises and how they would benefit him throughout the session. Pt is very invested in his therapy and will will continue to benefit from skilled PT intervention to address impairments, improve quality of life and attain therapy goals.   OBJECTIVE IMPAIRMENTS: Abnormal gait, cardiopulmonary status limiting activity, decreased activity tolerance, decreased balance, decreased coordination, decreased endurance, decreased knowledge of condition, decreased knowledge of use of DME, decreased mobility, difficulty walking, decreased ROM, decreased strength, decreased safety awareness, dizziness, impaired flexibility, impaired sensation, and pain.   ACTIVITY LIMITATIONS: carrying, lifting, bending, standing, squatting, stairs, transfers, locomotion level, and caring for others  PARTICIPATION LIMITATIONS: cleaning, laundry, driving, shopping, community activity, and yard work  PERSONAL FACTORS: Age, Behavior pattern, and 3+ comorbidities:   are also affecting  patient's functional outcome.   REHAB POTENTIAL: Excellent  CLINICAL DECISION MAKING: Evolving/moderate complexity  EVALUATION COMPLEXITY: Moderate  PLAN:  PT FREQUENCY: 1-2x/week  PT DURATION: 12 weeks  PLANNED INTERVENTIONS: 97164- PT Re-evaluation, 97750- Physical Performance Testing, 97110-Therapeutic exercises, 97530- Therapeutic activity, 97112- Neuromuscular re-education, 97535- Self Care, 02859- Manual therapy, [phone]- Gait training, Patient/Family education, Balance training, Stair training, Joint mobilization, Joint manipulation, Vestibular training, Visual/preceptual remediation/compensation, DME instructions, Cryotherapy, and Moist heat  PLAN FOR NEXT SESSION:  Exercises that promote single leg balance Continue working on retro gait and dual task activities     Documented by: Leonor Rode, SPT    Note: Portions of this document were prepared using Dragon voice recognition software and although reviewed may contain unintentional dictation errors in syntax, grammar, or spelling.  Lonni KATHEE Gainer PT ,DPT Physical Therapist- [place]  [facility]

## 2024-04-26 ENCOUNTER — Ambulatory Visit

## 2024-04-26 DIAGNOSIS — G5793 Unspecified mononeuropathy of bilateral lower limbs: Secondary | ICD-10-CM

## 2024-04-26 DIAGNOSIS — R262 Difficulty in walking, not elsewhere classified: Secondary | ICD-10-CM

## 2024-04-26 DIAGNOSIS — R2689 Other abnormalities of gait and mobility: Secondary | ICD-10-CM

## 2024-04-26 DIAGNOSIS — R269 Unspecified abnormalities of gait and mobility: Secondary | ICD-10-CM

## 2024-04-26 NOTE — Therapy (Signed)
 OUTPATIENT PHYSICAL THERAPY TREATMENT   Patient Name: Evan Forbes. MRN: [id number] DOB:Jul 02, 1931, 89 y.o., male Today's Date: 04/26/2024   PCP: Marsa Reyes Lenis, MD  REFERRING PROVIDER:   Maree Jannett POUR, MD    END OF SESSION:   PT End of Session - 04/26/24 1453     Visit Number 7    Number of Visits 24    Date for Recertification  06/22/24    Authorization Type Medicare A & B; Generic A[employer] secondary    Progress Note Due on Visit 10    PT Start Time 1450    PT Stop Time 1530    PT Time Calculation (min) 40 min    Equipment Utilized During Treatment Gait belt    Activity Tolerance Patient tolerated treatment well    Behavior During Therapy WFL for tasks assessed/performed           Past Medical History:  Diagnosis Date   Cancer (HCC)    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   PERTINENT HISTORY:  89yoM referred to OPPT for unsteadiness, imbalance 2/2 neuropathy. PMH: Hypertension, Arrhythmia, GERD, Glaucoma, Chronic kidney disease with eGFR of 57,  History of B12 deficiency  PAIN:  Are you having pain? No  PRECAUTIONS: Fall  WEIGHT BEARING RESTRICTIONS: No  FALLS: Has patient fallen in last 6 months? No and a  few near falls but has had no full falls in >2 years   LIVING ENVIRONMENT: Lives with: lives alone Lives in: House/apartment Stairs: Yes: Internal: 18 steps; on right going up and on left going up and External: 2 steps; on left going up Has following equipment at home: Single point cane, Grab bars, and walking stick or hiking pole   PLOF: Independent, Independent with basic ADLs, Independent with household mobility with device, Requires assistive device  for independence, and walking stick   PATIENT GOALS:  Increased strength in BLE Improve balance and be able to walk in the dark   OBJECTIVE:  Note: Objective measures were completed at Evaluation unless otherwise noted.                                                                                                                            TREATMENT DATE: 04/26/24  -backwards AMB with ball toss catch, forward AMB eyes closed  -AMB with head turns for add up 2 playin gcards  -AMB outside in grass: %kg ball toss x12, then run and lift from ground  -AMB back from gym with 5kg ball toss  -AMB on 2x4 balance beam 6x in // bars   PATIENT EDUCATION: Education details: Pt educated on increased ROM with decline toe raises.    Person educated: Patient Education method: Medical Illustrator Education comprehension: verbalized understanding and returned demonstration  HOME EXERCISE PROGRAM: Access Code: [email or web address] URL: [email or web address] Date: 03/31/2024 Prepared by: Massie Dollar  Exercises - Standing Tandem Balance with Counter Support  - 1 x daily - 3-4 x weekly - 3 sets - 10 reps - 30 seconds  hold - Standing March with Counter Support  - 1 x daily - 3-4 x weekly - 3 sets - 10 reps - 2 seconds  hold - Standing Single Leg Stance with Counter Support  - 1 x daily - 3-4 x weekly - 3 sets - 10 reps - 5 seconds  hold - Sit to Stand with Armchair  - 1 x daily - 7 x weekly - 3 sets - 10 reps  GOALS: Goals reviewed  with patient? Yes  SHORT TERM GOALS: Target date: 04/28/2024 Patient will be independent in home exercise program to improve strength/mobility for better functional independence with ADLs. Baseline: initiated 03/30/24 Goal status: INITIAL  LONG TERM GOALS: Target date: 06/23/2024  Patient will increase ABC scale score >80% to demonstrate better functional mobility and better confidence with ADLs.  Baseline: to be completed Goal status: INITIAL  2.  Patient (> 16 years old) will complete five times sit to stand test in <11 seconds indicating an increased LE strength and improved balance. Baseline: 14.09sec  Goal status: INITIAL  3.  Patient will increase Berg Balance score by > 6 points to demonstrate decreased fall risk during functional activities Baseline: 43 Goal status: INITIAL  4.  Patient will increase 10 meter walk test to >1.53m/s as to improve gait speed for better community ambulation and to reduce fall risk. Baseline:  0.84sec with walking stick Goal status: INITIAL  5.  Patient will reduce timed up and go to <11 seconds to reduce fall risk and demonstrate improved transfer/gait ability. Baseline: to be completed  Goal status: INITIAL  6.  Patient will increase FGA score to >22/30 as to demonstrate reduced fall risk and improved dynamic gait balance for better safety with community/home ambulation.  Baseline: to be completed  Goal status: INITIAL   ASSESSMENT:  CLINICAL IMPRESSION: Pt doing well with today's session, rises to all challenges. Pt taken outside for natural surfaces. Pt also requested reasoning of exercises and how they would benefit him throughout the session. Pt is very invested in his therapy and will will continue to benefit from skilled PT intervention to address impairments, improve quality of life and attain therapy goals.   OBJECTIVE IMPAIRMENTS: Abnormal gait, cardiopulmonary status limiting activity, decreased activity tolerance, decreased  balance, decreased coordination, decreased endurance, decreased knowledge of condition, decreased knowledge of use of DME, decreased mobility, difficulty walking, decreased ROM, decreased strength, decreased safety awareness, dizziness, impaired flexibility, impaired sensation, and pain.   ACTIVITY LIMITATIONS: carrying, lifting, bending, standing, squatting, stairs, transfers, locomotion level, and caring for others  PARTICIPATION LIMITATIONS: cleaning, laundry, driving, shopping, community activity, and yard work  PERSONAL FACTORS: Age, Behavior pattern, and 3+ comorbidities:   are also affecting patient's functional outcome.   REHAB POTENTIAL: Excellent  CLINICAL DECISION MAKING: Evolving/moderate complexity  EVALUATION COMPLEXITY: Moderate  PLAN:  PT FREQUENCY: 1-2x/week  PT DURATION: 12 weeks  PLANNED INTERVENTIONS: 97164- PT Re-evaluation, 97750- Physical Performance Testing, 97110-Therapeutic exercises, 97530- Therapeutic activity, 97112- Neuromuscular re-education, 97535- Self Care, 02859- Manual therapy, [phone]- Gait training, Patient/Family education, Balance training, Stair training, Joint mobilization, Joint manipulation, Vestibular training, Visual/preceptual remediation/compensation, DME instructions, Cryotherapy, and Moist heat  PLAN FOR NEXT SESSION:  Exercises that promote single leg balance Continue working on retro gait and dual task activities     3:09 PM, 04/26/24 Peggye JAYSON Linear, PT, DPT Physical Therapist - [facility]  Outpatient Physical Therapy- Main Campus [phone]

## 2024-04-27 ENCOUNTER — Encounter: Payer: Self-pay | Admitting: Urology

## 2024-04-27 ENCOUNTER — Ambulatory Visit (INDEPENDENT_AMBULATORY_CARE_PROVIDER_SITE_OTHER): Admitting: Urology

## 2024-04-27 VITALS — BP 146/74 | HR 95 | Ht 72.0 in | Wt 212.0 lb

## 2024-04-27 DIAGNOSIS — N401 Enlarged prostate with lower urinary tract symptoms: Secondary | ICD-10-CM

## 2024-04-27 DIAGNOSIS — R3129 Other microscopic hematuria: Secondary | ICD-10-CM | POA: Diagnosis not present

## 2024-04-27 DIAGNOSIS — R3914 Feeling of incomplete bladder emptying: Secondary | ICD-10-CM

## 2024-04-27 DIAGNOSIS — N200 Calculus of kidney: Secondary | ICD-10-CM | POA: Diagnosis not present

## 2024-04-27 LAB — MICROSCOPIC EXAMINATION

## 2024-04-27 LAB — BLADDER SCAN AMB NON-IMAGING

## 2024-04-27 NOTE — Patient Instructions (Signed)
 Scheduling number: [phone]

## 2024-04-27 NOTE — Progress Notes (Signed)
 04/27/2024 4:57 PM   Evan VEAR Canter Jr. [birth year] [id number]  Referring provider: Charlene Debby BROCKS, PA-C [address] [facility] [place],  [place] [address]  Chief Complaint  Patient presents with   Hematuria    HPI: Evan Forbes. is a 89 y.o. male referred for evaluation of microhematuria.  [facility] visit 03/30/2024 with a 2-week history of bilateral flank pain.  It was felt his pain was more in line with ileal costal impingement syndrome and his pain significantly improved with changes in his posture and gait Urinalysis at that visit showed 4 RBCs/hpf on microscopy  He also planes of a weak urinary stream and an occasional split stream Previously followed by urology [facility] and was last seen 06/11/2023 by Dr. Nicholaus.  Prior TURP/cystolitholapaxy by Dr. Narvis 05/18/2018 (45 g resected with benign pathology).  At that visit he had noted decreased force and caliber of his urinary stream.  DRE was unremarkable Presently on tamsulosin 0.8 mg daily and finasteride 5 mg daily Prior history recurrent stone disease and states he has a large stone in the kidney which was unable to be removed. 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 If you have any questions, ask your nurse or doctor.          alendronate 70 MG tablet Commonly known as: FOSAMAX Take 70 mg by mouth.   ALPRAZolam 0.25 MG  tablet Commonly known as: XANAX For sleep use 1/2 as needed   aspirin EC 81 MG tablet Take 325 mg by mouth.   clotrimazole-betamethasone cream Commonly known as: LOTRISONE Apply topically 2 (two) times daily.   cyanocobalamin 1000 MCG tablet Commonly known as: VITAMIN B12 Take 1,000 mcg by mouth.   DSS 100 MG Caps Take 100 mg by mouth.   DULoxetine 30 MG capsule Commonly known as: CYMBALTA Take 30 mg by mouth.   finasteride 5 MG tablet Commonly known as: PROSCAR Take 5 mg by mouth.   hydrochlorothiazide 25 MG tablet Commonly known as: HYDRODIURIL Take 25 mg by mouth daily.   losartan 100 MG tablet Commonly known as: COZAAR Take 100 mg by mouth.   metroNIDAZOLE 0.75 % gel Commonly known as: METROGEL Apply topically.   pantoprazole 20 MG tablet Commonly known as: PROTONIX Take 20 mg by mouth daily.   potassium chloride 10 MEQ tablet Commonly known as: KLOR-CON Take 10 mEq by mouth.   sodium chloride  0.65 % nasal spray Commonly known as: OCEAN   tamsulosin 0.4 MG Caps capsule Commonly known as: FLOMAX Take 0.8 mg by mouth.        Allergies:  Allergies  Allergen Reactions   Alendronate Sodium     Family History: No family history on file.  Social History:  has no history  on file for tobacco use, alcohol use, and drug use.   Physical Exam: BP (!) 146/74   Pulse 95   Ht 6' (1.829 m)   Wt 212 lb (96.2 kg)   SpO2 95%   BMI 28.75 kg/m   Constitutional:  Alert, No acute distress. HEENT: [place] AT Respiratory: Normal respiratory effort, no increased work of breathing. Psychiatric: Normal mood and affect.  Laboratory Data:  Urinalysis Dipstick trace protein/trace blood Microscopy 3-10 RBC   Assessment & Plan:    1. Microscopic hematuria Based on age his AUA hematuria restratification is high I discussed the recommended evaluation of high risk hematuria which includes a CT urogram and cystoscopy which includes a CT urogram and cystoscopy He  was agreeable to a CT urogram.  Creatinine August 2025 was 1.2 At this time he is unsure if he wants to proceed with cystoscopy and will think this over  2.  BPH with LUTS PVR today at 121 mL On finasteride 5 mg daily and tamsulosin 0.8 mg daily  3.  Recurrent nephrolithiasis CTU as above    Evan JAYSON Barba, MD  [facility] [address], [address] [place], [place] [address] [phone]

## 2024-04-28 ENCOUNTER — Ambulatory Visit

## 2024-04-28 DIAGNOSIS — R2689 Other abnormalities of gait and mobility: Secondary | ICD-10-CM

## 2024-04-28 DIAGNOSIS — G5793 Unspecified mononeuropathy of bilateral lower limbs: Secondary | ICD-10-CM

## 2024-04-28 DIAGNOSIS — R269 Unspecified abnormalities of gait and mobility: Secondary | ICD-10-CM

## 2024-04-28 DIAGNOSIS — R262 Difficulty in walking, not elsewhere classified: Secondary | ICD-10-CM

## 2024-04-28 LAB — MICROSCOPIC EXAMINATION

## 2024-04-28 LAB — URINALYSIS, COMPLETE
Bilirubin, UA: NEGATIVE
Glucose, UA: NEGATIVE
Ketones, UA: NEGATIVE
Leukocytes,UA: NEGATIVE
Nitrite, UA: NEGATIVE
Specific Gravity, UA: 1.015 (ref 1.005–1.030)
Urobilinogen, Ur: 0.2 mg/dL (ref 0.2–1.0)
pH, UA: 6 (ref 5.0–7.5)

## 2024-04-28 NOTE — Therapy (Signed)
 OUTPATIENT PHYSICAL THERAPY TREATMENT   Patient Name: Evan Forbes. MRN: [id number] DOB:Mar 23, 1932, 89 y.o., male Today's Date: 04/28/2024  PCP: Marsa Reyes Lenis, MD  REFERRING PROVIDER:   Maree Jannett POUR, MD    END OF SESSION:   PT End of Session - 04/28/24 1933     Visit Number 8    Number of Visits 24    Date for Recertification  06/22/24    Authorization Type Medicare A & B; Generic [employer] secondary    Progress Note Due on Visit 10    PT Start Time 1620    PT Stop Time 1700    PT Time Calculation (min) 40 min    Equipment Utilized During Treatment Gait belt    Activity Tolerance Patient tolerated treatment well;No increased pain;Patient limited by fatigue    Behavior During Therapy [facility] for tasks assessed/performed           Past Medical History:  Diagnosis Date   Cancer (HCC)    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 He remains pleased with progress made. Still feels some delayed onset muscle soreness that is improving.   PERTINENT HISTORY:  89yoM referred to OPPT for unsteadiness, imbalance 2/2 neuropathy. PMH: Hypertension, Arrhythmia, GERD, Glaucoma, Chronic kidney disease with eGFR of 57,  History of B12  deficiency  PAIN:  Are you having pain? No  PRECAUTIONS: Fall  WEIGHT BEARING RESTRICTIONS: No  FALLS: Has patient fallen in last 6 months? No and a few near falls but has had no full falls in >2 years   LIVING ENVIRONMENT: Lives with: lives alone Lives in: House/apartment Stairs: Yes: Internal: 18 steps; on right going up and on left going up and External: 2 steps; on left going up Has following equipment at home: Single point cane, Grab bars, and walking stick  or hiking pole   PLOF: Independent, Independent with basic ADLs, Independent with household mobility with device, Requires assistive device for independence, and walking stick   PATIENT GOALS:  Increased strength in BLE Improve balance and be able to walk in the dark   OBJECTIVE:  Note: Objective measures were completed at Evaluation unless otherwise noted.                                                                                                                            TREATMENT DATE: 04/28/24  -balance activty course overground 5ft inside: 2 airex foam steps, 1 red mat soft surface, 1blue mat soft surface, 4 step up *performed 8x total -course modified: low rocker board (8 degrees), 8 foam step, 10' foam step, red soft mat with 2 half-rolls beneath, 10 height trampoline, 1 foam mat with 3 QC hurdles.: perfomred 8 times miguard to modA   PATIENT EDUCATION: Education details: Pt educated on increased ROM with decline toe raises.    Person educated: Patient Education method: Medical Illustrator Education comprehension: verbalized understanding and returned demonstration  HOME EXERCISE PROGRAM: Access Code: [email or web address] URL: [email or web address] Date: 03/31/2024 Prepared by: Massie Dollar  Exercises - Standing Tandem Balance with Counter Support  - 1 x daily - 3-4 x weekly - 3 sets - 10 reps - 30 seconds  hold - Standing March with Counter Support  - 1 x daily - 3-4 x weekly - 3  sets - 10 reps - 2 seconds  hold - Standing Single Leg Stance with Counter Support  - 1 x daily - 3-4 x weekly - 3 sets - 10 reps - 5 seconds  hold - Sit to Stand with Armchair  - 1 x daily - 7 x weekly - 3 sets - 10 reps  GOALS: Goals reviewed with patient? Yes  SHORT TERM GOALS: Target date: 04/28/2024 Patient will be independent in home exercise program to improve strength/mobility for better functional independence with ADLs. Baseline: initiated 03/30/24 Goal status: INITIAL  LONG TERM GOALS: Target date: 06/23/2024  Patient will increase ABC scale score >80% to demonstrate better functional mobility and better confidence with ADLs.  Baseline: to be completed Goal status: INITIAL  2.  Patient (> 70 years old) will complete five times sit to stand test in <11 seconds indicating an increased LE strength and improved balance. Baseline: 14.09sec  Goal status: INITIAL  3.  Patient will increase Berg Balance score by > 6 points to demonstrate decreased fall risk during functional activities Baseline: 43 Goal status: INITIAL  4.  Patient will increase 10 meter walk test to >1.58m/s as to improve gait speed for better community ambulation and to reduce fall risk. Baseline:  0.84sec with walking stick Goal status: INITIAL  5.  Patient will reduce timed up and go to <11 seconds to reduce fall risk and demonstrate improved transfer/gait ability. Baseline: to be completed  Goal status: INITIAL  6.  Patient will increase FGA  score to >22/30 as to demonstrate reduced fall risk and improved dynamic gait balance for better safety with community/home ambulation.  Baseline: to be completed  Goal status: INITIAL   ASSESSMENT:  CLINICAL IMPRESSION: Pt educated on available captioning apps to help improve his inclusion in social and family activity currently impacted by hardness of hearing. Spent rest of session working on variable challenge balance courses with a variety of challenges catered  to pt's specific neuromotor impairments- provided very close minGuard and occasional modA for LOB recovery guarding. Pt enjoys accepting these more difficult challenges and shows improvement within session as he cognitively processes problem solving strategies for best use of his current faculties. Pt is very invested in his therapy and will continue to benefit from skilled PT intervention to address impairments, improve quality of life and attain therapy goals.   OBJECTIVE IMPAIRMENTS: Abnormal gait, cardiopulmonary status limiting activity, decreased activity tolerance, decreased balance, decreased coordination, decreased endurance, decreased knowledge of condition, decreased knowledge of use of DME, decreased mobility, difficulty walking, decreased ROM, decreased strength, decreased safety awareness, dizziness, impaired flexibility, impaired sensation, and pain.   ACTIVITY LIMITATIONS: carrying, lifting, bending, standing, squatting, stairs, transfers, locomotion level, and caring for others  PARTICIPATION LIMITATIONS: cleaning, laundry, driving, shopping, community activity, and yard work  PERSONAL FACTORS: Age, Behavior pattern, and 3+ comorbidities:   are also affecting patient's functional outcome.   REHAB POTENTIAL: Excellent  CLINICAL DECISION MAKING: Evolving/moderate complexity  EVALUATION COMPLEXITY: Moderate  PLAN:  PT FREQUENCY: 1-2x/week  PT DURATION: 12 weeks  PLANNED INTERVENTIONS: 97164- PT Re-evaluation, 97750- Physical Performance Testing, 97110-Therapeutic exercises, 97530- Therapeutic activity, 97112- Neuromuscular re-education, 97535- Self Care, 02859- Manual therapy, [phone]- Gait training, Patient/Family education, Balance training, Stair training, Joint mobilization, Joint manipulation, Vestibular training, Visual/preceptual remediation/compensation, DME instructions, Cryotherapy, and Moist heat  PLAN FOR NEXT SESSION:  Exercises that promote single leg  balance Continue working on retro gait and dual task activities     7:34 PM, 04/28/24 Peggye JAYSON Linear, PT, DPT Physical Therapist - [facility]  Outpatient Physical Therapy- Main Campus [phone]

## 2024-05-02 ENCOUNTER — Ambulatory Visit

## 2024-05-02 DIAGNOSIS — G5793 Unspecified mononeuropathy of bilateral lower limbs: Secondary | ICD-10-CM

## 2024-05-02 DIAGNOSIS — R2689 Other abnormalities of gait and mobility: Secondary | ICD-10-CM

## 2024-05-02 DIAGNOSIS — R262 Difficulty in walking, not elsewhere classified: Secondary | ICD-10-CM

## 2024-05-02 DIAGNOSIS — R269 Unspecified abnormalities of gait and mobility: Secondary | ICD-10-CM

## 2024-05-02 NOTE — Therapy (Signed)
 OUTPATIENT PHYSICAL THERAPY TREATMENT   Patient Name: Evan Forbes. 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  Rationale for Evaluation and Treatment: Rehabilitation  SUBJECTIVE:                                                                                                                                                                                             SUBJECTIVE STATEMENT: Pt. Reports doing well today and well rested. Patient states that his delayed onset muscle soreness has about gone away. He reports he has been working on raising his toes as he has difficulty when walking due to toes dragging. [place] [employer] [place].   PERTINENT HISTORY:  89yoM referred to OPPT for unsteadiness, imbalance 2/2 neuropathy. PMH: Hypertension, Arrhythmia, GERD, Glaucoma, Chronic kidney disease with eGFR of 57,  History of B12 deficiency  PAIN:  Are you having pain? No  PRECAUTIONS: Fall  WEIGHT BEARING RESTRICTIONS: No  FALLS: Has patient fallen in last 6 months? No and a few near falls but has had no full falls in >2 years   LIVING ENVIRONMENT: Lives with: lives alone Lives in: House/apartment Stairs: Yes: Internal: 18 steps; on right going up and on left going up and External: 2 steps; on left going up Has following equipment at home: Single point cane, Grab bars, and  walking stick or hiking pole   PLOF: Independent, Independent with basic ADLs, Independent with household mobility with device, Requires assistive device for independence, and walking stick   PATIENT GOALS:  Increased strength in BLE Improve balance and be able to walk in the dark   OBJECTIVE:  Note: Objective measures were completed at Evaluation unless otherwise noted.  TREATMENT DATE: 05/02/24  -Airex Marching: x15 each. -SLS: 3x30'' each.  -Toe Taps on spikey cones. 5 cones 3 cycles each LE.  -Sit to stand with Med. Ball: 6.6 lb. 2x10 -Retro walking: 5 laps in // bars.  -Airex balance beam:  Tandem walk x5 laps in // bars. Side step x5 laps in // bars. -Airex stance with pertubations: 30x in random directions.   Gait outside: Walking over grass and mulch outside while kicking soccer ball x5'     PATIENT EDUCATION: Education details: Pt educated on proper safety when ascending/descending steps/stairs at home.  Person educated: Patient Education method: Medical Illustrator Education comprehension: verbalized understanding and returned demonstration  HOME EXERCISE PROGRAM: Access Code: [email or web address] URL: [email or web address] Date: 03/31/2024 Prepared by: Massie Dollar  Exercises - Standing Tandem Balance with Counter Support  - 1 x daily - 3-4 x weekly - 3 sets - 10 reps - 30 seconds  hold - Standing March with Counter Support  - 1 x daily - 3-4 x weekly - 3 sets - 10 reps - 2 seconds  hold - Standing Single Leg Stance with Counter Support  - 1 x daily - 3-4 x weekly - 3 sets - 10 reps - 5 seconds  hold - Sit to Stand with Armchair  - 1 x daily - 7 x weekly - 3 sets - 10 reps  GOALS: Goals reviewed with patient? Yes  SHORT TERM GOALS: Target date: 04/28/2024 Patient will be independent in home exercise program to improve  strength/mobility for better functional independence with ADLs. Baseline: initiated 03/30/24 Goal status: INITIAL  LONG TERM GOALS: Target date: 06/23/2024  Patient will increase ABC scale score >80% to demonstrate better functional mobility and better confidence with ADLs.  Baseline: to be completed Goal status: INITIAL  2.  Patient (> 75 years old) will complete five times sit to stand test in <11 seconds indicating an increased LE strength and improved balance. Baseline: 14.09sec  Goal status: INITIAL  3.  Patient will increase Berg Balance score by > 6 points to demonstrate decreased fall risk during functional activities Baseline: 43 Goal status: INITIAL  4.  Patient will increase 10 meter walk test to >1.74m/s as to improve gait speed for better community ambulation and to reduce fall risk. Baseline:  0.84sec with walking stick Goal status: INITIAL  5.  Patient will reduce timed up and go to <11 seconds to reduce fall risk and demonstrate improved transfer/gait ability. Baseline: to be completed  Goal status: INITIAL  6.  Patient will increase FGA score to >22/30 as to demonstrate reduced fall risk and improved dynamic gait balance for better safety with community/home ambulation.  Baseline: to be completed  Goal status: INITIAL   ASSESSMENT:  CLINICAL IMPRESSION:  Pt. Performed various balance activities today focusing on dynamic balance and increased single leg activities. He continues to perform well following cues given for mechanics and techniques to improve balance. Patient was noted to have much more difficulty with regaining balance when pulled backward today during perturbations exercises on Airex. SLS balance improved with practice over 3 trial period. Ambulation over grass/mulch was good with no LOB, however, CGA (with occasional min/mod. A.). with gait belt given throughout entire treatment for safety. Sit to stands performed with 6.6 lb. Medicine ball for increased  LE strength, which patient was noted to have difficulty with due to decreased LE strength and decreased endurance. Overall, patient continues to work hard during his PT treatment sessions and expresses his satisfaction  with progress thus far. Pt is very invested in his therapy and will continue to benefit from skilled PT intervention to address impairments, improve quality of life and attain therapy goals.   OBJECTIVE IMPAIRMENTS: Abnormal gait, cardiopulmonary status limiting activity, decreased activity tolerance, decreased balance, decreased coordination, decreased endurance, decreased knowledge of condition, decreased knowledge of use of DME, decreased mobility, difficulty walking, decreased ROM, decreased strength, decreased safety awareness, dizziness, impaired flexibility, impaired sensation, and pain.   ACTIVITY LIMITATIONS: carrying, lifting, bending, standing, squatting, stairs, transfers, locomotion level, and caring for others  PARTICIPATION LIMITATIONS: cleaning, laundry, driving, shopping, community activity, and yard work  PERSONAL FACTORS: Age, Behavior pattern, and 3+ comorbidities:   are also affecting patient's functional outcome.   REHAB POTENTIAL: Excellent  CLINICAL DECISION MAKING: Evolving/moderate complexity  EVALUATION COMPLEXITY: Moderate  PLAN:  PT FREQUENCY: 1-2x/week  PT DURATION: 12 weeks  PLANNED INTERVENTIONS: 97164- PT Re-evaluation, 97750- Physical Performance Testing, 97110-Therapeutic exercises, 97530- Therapeutic activity, 97112- Neuromuscular re-education, 97535- Self Care, 02859- Manual therapy, [phone]- Gait training, Patient/Family education, Balance training, Stair training, Joint mobilization, Joint manipulation, Vestibular training, Visual/preceptual remediation/compensation, DME instructions, Cryotherapy, and Moist heat  PLAN FOR NEXT SESSION:  Perform progress note at patient's upcoming 10th visit.  Continue working to improve LE strength,  balance, and independence with functional tasks.    3:31 PM, 05/02/24 Norman Sharps, PT, DPT Physical Therapist - [facility]  Outpatient Physical Therapy- Main Campus [phone]

## 2024-05-04 ENCOUNTER — Ambulatory Visit

## 2024-05-04 DIAGNOSIS — R269 Unspecified abnormalities of gait and mobility: Secondary | ICD-10-CM

## 2024-05-04 DIAGNOSIS — R262 Difficulty in walking, not elsewhere classified: Secondary | ICD-10-CM

## 2024-05-04 DIAGNOSIS — R2689 Other abnormalities of gait and mobility: Secondary | ICD-10-CM

## 2024-05-04 DIAGNOSIS — G5793 Unspecified mononeuropathy of bilateral lower limbs: Secondary | ICD-10-CM

## 2024-05-04 NOTE — Therapy (Signed)
 OUTPATIENT PHYSICAL THERAPY TREATMENT Physical Therapy Progress Note   Dates of reporting period  03/30/2024   to   05/04/2024   Patient Name: Evan Forbes. MRN: [id number] DOB:[birth year], 89 y.o., male Today's Date: 05/04/2024  PCP: Marsa Reyes Lenis, MD  REFERRING PROVIDER:   Maree Jannett POUR, MD    END OF SESSION:   PT End of Session - 05/04/24 1613     Visit Number 10    Number of Visits 24    Date for Recertification  06/22/24    Authorization Type Medicare A & B; Generic Aet[employer] secondary    Progress Note Due on Visit 10    PT Start Time 1415    PT Stop Time 1456    PT Time Calculation (min) 41 min    Equipment Utilized During Treatment Gait belt    Activity Tolerance Patient tolerated treatment well;No increased pain;Patient limited by fatigue    Behavior During Therapy [facility] for tasks assessed/performed            Past Medical History:  Diagnosis Date   Cancer (HCC)    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 Reports doing good today. He reports that the feeling in the bottom of his feet seems to be coming back. Patient reports that he can now feel when his toes touch the ground. He also reports his neuropathy now goes only up to shin instead of up past  the knee. Patient reports that his balance has significantly improved since beginning PT treatment. He reports that his ability to walk on grass/uneven surfaces is much better.   PERTINENT HISTORY:  89yoM referred to OPPT for unsteadiness, imbalance 2/2 neuropathy. PMH: Hypertension, Arrhythmia, GERD, Glaucoma, Chronic kidney disease with eGFR of 57,  History of B12 deficiency  PAIN:  Are you having pain? No  PRECAUTIONS: Fall  WEIGHT BEARING  RESTRICTIONS: No  FALLS: Has patient fallen in last 6 months? No and a few near falls but has had no full falls in >2 years   LIVING ENVIRONMENT: Lives with: lives alone Lives in: House/apartment Stairs: Yes: Internal: 18 steps; on right going up and on left going up and External: 2 steps; on left going up Has following equipment at home: Single point cane, Grab bars, and walking stick or hiking pole   PLOF: Independent, Independent with basic ADLs, Independent with household mobility with device, Requires assistive device for independence, and walking stick   PATIENT GOALS:  Increased strength in BLE Improve balance and be able to walk in the dark   OBJECTIVE:  Note: Objective measures were completed at Evaluation unless otherwise noted.                                                                                                                            TREATMENT DATE: 05/04/24  Progress note taken today - see long term goals and assessment sections.      PATIENT EDUCATION: Education details: Pt educated on proper safety when ascending/descending steps/stairs at home.  Person educated: Patient Education method: Medical Illustrator Education comprehension: verbalized understanding and returned demonstration  HOME EXERCISE PROGRAM: Access Code: [email or web address] URL: [email or web address] Date: 03/31/2024 Prepared by: Massie Dollar  Exercises - Standing Tandem Balance with Counter Support  - 1 x daily - 3-4 x  weekly - 3 sets - 10 reps - 30 seconds  hold - Standing March with Counter Support  - 1 x daily - 3-4 x weekly - 3 sets - 10 reps - 2 seconds  hold - Standing Single Leg Stance with Counter Support  - 1 x daily - 3-4 x weekly - 3 sets - 10 reps - 5 seconds  hold - Sit to Stand with Armchair  - 1 x daily - 7 x weekly - 3 sets - 10 reps  GOALS: Goals reviewed with patient? Yes  SHORT TERM GOALS: Target date: 04/28/2024 Patient will be independent in home exercise program to improve strength/mobility for better functional independence with ADLs. Baseline: initiated 03/30/24 Goal status: Met. Pt demonstrates independence with current HEP.   LONG TERM GOALS: Target date: 06/23/2024  Patient will increase ABC scale score >80% to demonstrate better functional mobility and better confidence with ADLs.  Baseline: to be completed Goal status: Ongoing: 75%  2.  Patient (> 81 years old) will complete five times sit to stand test in <11 seconds indicating an increased LE strength and improved balance. Baseline: 14.09sec  05/04/2024: 10.07 sec Goal status: Met.   3.  Patient will increase Berg Balance score by > 6 points to demonstrate decreased fall risk during functional activities Baseline: 43 Goal status: Met: 51/56  4.  Patient will increase 10 meter walk test to >1.73m/s as to improve gait speed for better community ambulation and to reduce fall risk. Baseline:  0.84sec with walking stick Goal status:  Met: 1.35 m/s  5.  Patient will reduce timed up and go to <11 seconds to reduce fall risk and demonstrate improved transfer/gait ability. Baseline: 05/04/24:10.7'' Goal status: MET  6.  Patient will increase FGA score to >22/30 as to demonstrate reduced fall risk and improved dynamic gait balance for better safety with community/home ambulation.  Baseline: to be completed  Goal status: INITIAL  7. Patient will demonstrate improved gait mechanics with good bilateral foot clearance during  swing phase when ambulating 1000' without AD.          Baseline (05/04/2024): Patient begins to have lack of foot clearance bilaterally when walking for prolonged distances.  Goal Status: Initial   8. Patient will demonstrate independence with exercise routine in the [facility] by discharge allowing him to transition his resistive and aerobic [facility] to continue improving strength/balance following PT discharge.   BASELINE (05/04/2024): Pt has yet to perform routine or receive instruction in [facility] and reports he really wishes to do so prior to discharge allowing him to go to local Y with improved confidence.   Goal Status: Initial  ASSESSMENT:  CLINICAL IMPRESSION: 05/04/2024:  Progress Note:  Subjectively, patient reports he has made significant improvement in balance since beginning PT treatments. He reports his confidence has improved with balance. He also reports that his neuropathy in feet is slowly reducing and he is able to feel his toes on the ground. He does report having much difficulty with certain balance exercises at home such as tandem stance and the balance activities with his eyes closed.   Objectively, patient demonstrated significant improvements toward his goals today. He showed increased gait speed during the TUG and the 10 meter walk test meeting both of those goals. He also improved BERG balance score to 51/56 showing reduced risk for falls. Patient's confidence has improved with 75% score on the ABC Scale, but is still slightly lower than his goal (80%). It was noted today that though his gait speed, balance, and confidence has improved, he continues to have decreased balance with certain activities. He was noted when ambulating 1000' today that he begins to intermittently drag the feet during swing phase due to lack of ankle DF. He also tends to veer L and R when performing dual task such as having conversation. During the BERG it was noted that he continues  to have very difficult time balancing when his BOS is reduced and that he tends to walk/stand with widened step width.   Overall, patient has made good improvements in the past 10 visits, but still has some balance deficits as well as gait deficits making him a higher risk for falls. Patient and I discussed his progress and POC. He expressed to me that he really wants to continue to work on his balance and confidence with balance. He reports having a 3 mile hiking trail on his property that he wishes to be able to do. He also reports that he would like to get the proper education and instruction on gym equipment and exercises in the [facility] so that he can go to his local Y following discharge with improved confidence and safety when performing exercises independently. I recommend patient continue PT treatment at this time for further improvement toward previous and updated goals. Pt is a good candidate for continuing PT 2x/week to improve on deficits noted above.    OBJECTIVE IMPAIRMENTS: Abnormal gait, cardiopulmonary status limiting activity, decreased activity tolerance, decreased balance, decreased coordination, decreased endurance, decreased  knowledge of condition, decreased knowledge of use of DME, decreased mobility, difficulty walking, decreased ROM, decreased strength, decreased safety awareness, dizziness, impaired flexibility, impaired sensation, and pain.   ACTIVITY LIMITATIONS: carrying, lifting, bending, standing, squatting, stairs, transfers, locomotion level, and caring for others  PARTICIPATION LIMITATIONS: cleaning, laundry, driving, shopping, community activity, and yard work  PERSONAL FACTORS: Age, Behavior pattern, and 3+ comorbidities:   are also affecting patient's functional outcome.   REHAB POTENTIAL: Excellent  CLINICAL DECISION MAKING: Evolving/moderate complexity  EVALUATION COMPLEXITY: Moderate  PLAN:  PT FREQUENCY: 1-2x/week  PT DURATION: 12 weeks  PLANNED  INTERVENTIONS: 97164- PT Re-evaluation, 97750- Physical Performance Testing, 97110-Therapeutic exercises, 97530- Therapeutic activity, 97112- Neuromuscular re-education, 97535- Self Care, [id number]- Manual therapy, [phone]- Gait training, Patient/Family education, Balance training, Stair training, Joint mobilization, Joint manipulation, Vestibular training, Visual/preceptual remediation/compensation, DME instructions, Cryotherapy, and Moist heat  PLAN FOR NEXT SESSION:  Perform progress note at patient's upcoming 10th visit.  Continue working to improve LE strength, balance, and independence with functional tasks.    5:17 PM, 05/04/24 Norman Sharps, PT, DPT Physical Therapist - [place] [facility]  Outpatient Physical Therapy- Main Campus [phone]

## 2024-05-06 ENCOUNTER — Ambulatory Visit
Admission: RE | Admit: 2024-05-06 | Discharge: 2024-05-06 | Disposition: A | Source: Ambulatory Visit | Attending: Urology | Admitting: Urology

## 2024-05-06 DIAGNOSIS — R3129 Other microscopic hematuria: Secondary | ICD-10-CM | POA: Diagnosis present

## 2024-05-06 MED ORDER — IOHEXOL 300 MG/ML  SOLN
100.0000 mL | Freq: Once | INTRAMUSCULAR | Status: AC | PRN
  Administered 2024-05-06: 100 mL via INTRAVENOUS

## 2024-05-09 ENCOUNTER — Ambulatory Visit

## 2024-05-09 DIAGNOSIS — R2689 Other abnormalities of gait and mobility: Secondary | ICD-10-CM | POA: Diagnosis present

## 2024-05-09 DIAGNOSIS — G5793 Unspecified mononeuropathy of bilateral lower limbs: Secondary | ICD-10-CM | POA: Diagnosis present

## 2024-05-09 DIAGNOSIS — R262 Difficulty in walking, not elsewhere classified: Secondary | ICD-10-CM | POA: Diagnosis present

## 2024-05-09 DIAGNOSIS — R269 Unspecified abnormalities of gait and mobility: Secondary | ICD-10-CM | POA: Diagnosis present

## 2024-05-09 NOTE — Therapy (Signed)
 OUTPATIENT PHYSICAL THERAPY TREATMENT  Patient Name: Evan Forbes. MRN: [id number] DOB:28-Jun-1931, 89 y.o., male Today's Date: 05/09/2024  PCP: Marsa Reyes Lenis, MD  REFERRING PROVIDER:   Maree Jannett POUR, MD    END OF SESSION:   PT End of Session - 05/09/24 1537     Visit Number 11    Number of Visits 24    Date for Recertification  06/22/24    Authorization Type Medicare A & B; Generic Aetna secondary    Progress Note Due on Visit 10    PT Start Time 1533    PT Stop Time 1613    PT Time Calculation (min) 40 min    Equipment Utilized During Treatment Gait belt    Activity Tolerance Patient tolerated treatment well;No increased pain;Patient limited by fatigue    Behavior During Therapy [facility] for tasks assessed/performed           Past Medical History:  Diagnosis Date   Cancer (HCC)    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 He is ready to start at the [facility].   PERTINENT HISTORY:  89yoM referred to OPPT for unsteadiness, imbalance 2/2 neuropathy. PMH: Hypertension, Arrhythmia, GERD, Glaucoma, Chronic kidney disease with eGFR of 57,  History of B12  deficiency  PAIN:  Are you having pain? No  PRECAUTIONS: Fall  WEIGHT BEARING RESTRICTIONS: No  FALLS: Has patient fallen in last 6 months? No and a few near falls but has had no full falls in >2 years   LIVING ENVIRONMENT: Lives with: lives alone Lives in: House/apartment Stairs: Yes: Internal: 18 steps; on right going up and on left going up and External: 2 steps; on left going up Has following equipment at home: Single point cane, Grab bars, and  walking stick or hiking pole   PLOF: Independent, Independent with basic ADLs, Independent with household mobility with device, Requires assistive device for independence, and walking stick   PATIENT GOALS:  Increased strength in BLE Improve balance and be able to walk in the dark   OBJECTIVE:  Note: Objective measures were completed at Evaluation unless otherwise noted.                                                                                                                            TREATMENT DATE: 05/09/24 -131ft AMB  overground GTB at knees (59sec)  -314ft AMB overground BTB at knees ( 68m 0s)  -317ft AMB overground BTB at knees and carry 5kg ball ( 40m57s)   -obstacle balance course:  2lb AW bilat, GTB at knees, AMB over foam pads, red mat, orange hurdles: single item carry and deliver from 2-12 lbs. (8 minutes)  Multiple trips, min-modA for recovery  -obstacle balance course:  2lb AW bilat, WITHOUT GTB at knees, AMB over foam pads, red mat, orange hurdles: single item carry and deliver from 2-12 lbs. (4 minutes)   PATIENT EDUCATION: Education details: Pt educated on proper safety when ascending/descending steps/stairs at home.  Person educated: Patient Education method: Medical Illustrator Education comprehension: verbalized understanding and returned demonstration  HOME EXERCISE PROGRAM: Access Code: [email or web address] URL: [email or web address] Date: 03/31/2024 Prepared by: Massie Dollar  Exercises - Standing Tandem Balance with Counter Support  - 1 x daily - 3-4 x weekly - 3 sets - 10 reps - 30 seconds  hold - Standing March with Counter Support  - 1 x daily - 3-4 x weekly - 3 sets - 10 reps - 2 seconds  hold - Standing Single Leg Stance with Counter Support  - 1 x daily - 3-4 x weekly - 3 sets - 10 reps - 5 seconds  hold - Sit to Stand with Armchair  - 1 x daily - 7 x weekly - 3 sets - 10 reps  GOALS: Goals reviewed with patient? Yes  SHORT TERM GOALS: Target date: 04/28/2024 Patient will be independent in home exercise program to improve strength/mobility for better functional independence with ADLs. Baseline: initiated 03/30/24 Goal status: Met. Pt demonstrates independence with current HEP.   LONG TERM GOALS: Target date: 06/23/2024  Patient will increase ABC scale score >80% to demonstrate better functional mobility and better confidence with ADLs.  Baseline: to be completed Goal status: Ongoing: 75%  2.  Patient (> 45 years old) will complete five times sit to stand test in <11 seconds indicating an increased LE strength and improved balance. Baseline: 14.09sec  05/04/2024: 10.07 sec Goal status: Met.   3.  Patient will increase Berg Balance score by > 6 points to demonstrate decreased fall risk during functional activities Baseline: 43 Goal status: Met: 51/56  4.  Patient will increase 10 meter walk test to >1.27m/s as to improve gait speed for better community ambulation and to  reduce fall risk. Baseline:  0.84sec with walking stick Goal status: Met: 1.35 m/s  5.  Patient will reduce timed up and go to <11 seconds to reduce fall risk and demonstrate improved transfer/gait ability. Baseline: 05/04/24:10.7'' Goal status: MET  6.  Patient will increase FGA score to >22/30 as to demonstrate reduced fall risk and improved dynamic gait balance for better safety with community/home ambulation.  Baseline: to be completed  Goal status: INITIAL  7.  Patient will demonstrate improved gait mechanics with good bilateral foot clearance during swing phase when ambulating 1000' without AD.          Baseline (05/04/2024): Patient begins to have lack of foot clearance bilaterally when walking for prolonged distances.  Goal Status: Initial   8. Patient will demonstrate independence with exercise routine in the [facility] by discharge allowing him to transition his resistive and aerobic [facility] to [facility] to continue improving strength/balance following PT discharge.   BASELINE (05/04/2024): Pt has yet to perform routine or receive instruction in [facility] and reports he really wishes to do so prior to discharge allowing him to go to local Y with improved confidence.   Goal Status: Initial  ASSESSMENT:  CLINICAL IMPRESSION: Continued with high level balance activity purposfully including some longer form, fatiguing activity so pt can learn about fluctuations in limitations with prolonged activity. Pt contiues to remains significantly motivated and energized about regaining prior function. Pt will work with trainer at [employer] for instructions on establishing a generalized conditioning program. Patient will benefit from skilled physical therapy intervention to reduce deficits and impairments identified in evaluation, in order to reduce pain, improve quality of life, and maximize activity tolerance for ADL, IADL, and leisure/fitness. Physical therapy will help pt achieve long and short term goals of care.    OBJECTIVE IMPAIRMENTS: Abnormal gait, cardiopulmonary status limiting activity, decreased activity tolerance, decreased balance, decreased coordination, decreased endurance, decreased knowledge of condition, decreased knowledge of use of DME, decreased mobility, difficulty walking, decreased ROM, decreased strength, decreased safety awareness, dizziness, impaired flexibility, impaired sensation, and pain.   ACTIVITY LIMITATIONS: carrying, lifting,  bending, standing, squatting, stairs, transfers, locomotion level, and caring for others  PARTICIPATION LIMITATIONS: cleaning, laundry, driving, shopping, community activity, and yard work  PERSONAL FACTORS: Age, Behavior pattern, and 3+ comorbidities:   are also affecting patient's functional outcome.   REHAB POTENTIAL: Excellent  CLINICAL DECISION MAKING: Evolving/moderate complexity  EVALUATION COMPLEXITY: Moderate  PLAN:  PT FREQUENCY: 1-2x/week  PT DURATION: 12 weeks  PLANNED INTERVENTIONS: 97164- PT Re-evaluation, 97750- Physical Performance Testing, 97110-Therapeutic exercises, 97530- Therapeutic activity, 97112- Neuromuscular re-education, 97535- Self Care, 02859- Manual therapy, [phone]- Gait training, Patient/Family education, Balance training, Stair training, Joint mobilization, Joint manipulation, Vestibular training, Visual/preceptual remediation/compensation, DME instructions, Cryotherapy, and Moist heat  PLAN FOR NEXT SESSION:  Perform progress note at patient's upcoming 10th visit.  Continue working to improve LE strength, balance, and independence with functional tasks.    3:42 PM, 05/09/24 Norman Sharps, PT, DPT Physical Therapist - [place] [facility]  Outpatient Physical Therapy- Main Campus [phone]

## 2024-05-11 ENCOUNTER — Ambulatory Visit

## 2024-05-12 ENCOUNTER — Ambulatory Visit

## 2024-05-12 DIAGNOSIS — R269 Unspecified abnormalities of gait and mobility: Secondary | ICD-10-CM

## 2024-05-12 DIAGNOSIS — R2689 Other abnormalities of gait and mobility: Secondary | ICD-10-CM

## 2024-05-12 DIAGNOSIS — G5793 Unspecified mononeuropathy of bilateral lower limbs: Secondary | ICD-10-CM

## 2024-05-12 DIAGNOSIS — R262 Difficulty in walking, not elsewhere classified: Secondary | ICD-10-CM

## 2024-05-12 NOTE — Therapy (Signed)
 OUTPATIENT PHYSICAL THERAPY TREATMENT  Patient Name: Evan Forbes. MRN: [id number] DOB:[birth year], 89 y.o., male Today's Date: 05/12/2024  PCP: Marsa Reyes Lenis, MD  REFERRING PROVIDER:   Maree Jannett POUR, MD    END OF SESSION:   PT End of Session - 05/12/24 1616     Visit Number 12    Number of Visits 24    Date for Recertification  06/22/24    Authorization Type Medicare A & B; Generic [employer] secondary    Progress Note Due on Visit 10    PT Start Time 1616    Equipment Utilized During Treatment Gait belt    Activity Tolerance Patient tolerated treatment well;No increased pain;Patient limited by fatigue    Behavior During Therapy [facility] for tasks assessed/performed           Past Medical History:  Diagnosis Date   Cancer (HCC)    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  Rationale for Evaluation and Treatment: Rehabilitation  SUBJECTIVE:                                                                                                                                                                                             SUBJECTIVE STATEMENT: Pt reports doing good today. Continues to work on his tandem balance at home which is improving. Continues to report seeing improvements throughout his POC. Continues to have goal of walking 3 miles around property.    PERTINENT HISTORY:  89yoM referred to OPPT for unsteadiness, imbalance 2/2 neuropathy. PMH: Hypertension, Arrhythmia, GERD, Glaucoma, Chronic kidney disease with eGFR of 57,  History of B12 deficiency  PAIN:  Are you having pain? No  PRECAUTIONS: Fall  WEIGHT BEARING RESTRICTIONS:  No  FALLS: Has patient fallen in last 6 months? No and a few near falls but has had no full falls in >2 years   LIVING ENVIRONMENT: Lives with: lives alone Lives in: House/apartment Stairs: Yes: Internal: 18 steps; on right going up and on left going up and External: 2 steps; on left going up Has following equipment at home: Single point cane, Grab bars, and walking stick or hiking pole   PLOF: Independent, Independent with basic ADLs, Independent with household mobility with device, Requires assistive device  for independence, and walking stick   PATIENT GOALS:  Increased strength in BLE Improve balance and be able to walk in the dark   OBJECTIVE:  Note: Objective measures were completed at Evaluation unless otherwise noted.                                                                                                                            TREATMENT DATE: 05/12/24 -1300' ambulation with increased stride length and gait speed.  -Pick up weighted balls (4) from ground level to overhead on cabinet x1 and back to floor x1 each.  -Sit to stands with OH ball press 5 kg. 2x10.  -Sliding spikey cones out laterally and in laterally x8 each side for improved reactive balance.  -900' ambulation with dual tasks naming items in sea and cities while maintaining increased gait speed and stride length.   PATIENT EDUCATION: Education details: Pt educated on proper safety when ascending/descending steps/stairs at home.  Person educated: Patient Education method: Medical Illustrator Education comprehension: verbalized understanding and returned demonstration  HOME EXERCISE PROGRAM: Access Code: [email or web address] URL: [email or web address] Date: 03/31/2024 Prepared by: Massie Dollar  Exercises - Standing Tandem Balance with Counter Support  - 1 x daily - 3-4 x weekly - 3 sets - 10 reps - 30 seconds  hold - Standing March with Counter Support  - 1 x daily - 3-4 x weekly - 3  sets - 10 reps - 2 seconds  hold - Standing Single Leg Stance with Counter Support  - 1 x daily - 3-4 x weekly - 3 sets - 10 reps - 5 seconds  hold - Sit to Stand with Armchair  - 1 x daily - 7 x weekly - 3 sets - 10 reps  GOALS: Goals reviewed with patient? Yes  SHORT TERM GOALS: Target date: 04/28/2024 Patient will be independent in home exercise program to improve strength/mobility for better functional independence with ADLs. Baseline: initiated 03/30/24 Goal status: Met. Pt demonstrates independence with current HEP.   LONG TERM GOALS: Target date: 06/23/2024  Patient will increase ABC scale score >80% to demonstrate better functional mobility and better confidence with ADLs.  Baseline: to be completed Goal status: Ongoing: 75%  2.  Patient (> 33 years old) will complete five times sit to stand test in <11 seconds indicating an increased LE strength and improved balance. Baseline: 14.09sec  05/04/2024: 10.07 sec Goal status: Met.   3.  Patient will increase Berg Balance score by > 6 points to demonstrate decreased fall risk during functional activities Baseline: 43 Goal status: Met: 51/56  4.  Patient will increase 10 meter walk test to >1.50m/s as to improve gait speed for better community ambulation and to reduce fall risk. Baseline:  0.84sec with walking stick Goal status: Met: 1.35 m/s  5.  Patient will reduce timed up and go to <11 seconds to reduce fall risk and demonstrate improved transfer/gait ability. Baseline: 05/04/24:10.7'' Goal status: MET  6.  Patient will increase FGA score to >22/30 as to demonstrate reduced fall risk and improved dynamic gait balance for better safety with community/home ambulation.  Baseline: to be completed  Goal status: INITIAL  7. Patient will demonstrate improved gait mechanics with good bilateral foot clearance during swing phase when ambulating 1000' without AD.          Baseline (05/04/2024): Patient begins to have lack of foot  clearance bilaterally when walking for prolonged distances.  Goal Status: Initial   8. Patient will demonstrate independence with exercise routine in the [facility] by discharge allowing him to transition his resistive and [facility] to continue improving strength/balance following PT discharge.   BASELINE (05/04/2024): Pt has yet to perform routine or receive instruction in [facility] and reports he really wishes to do so prior to discharge allowing him to go to local [facility] with improved confidence.   Goal Status: Initial  ASSESSMENT:  CLINICAL IMPRESSION: Patient began today with ambulating 1300' focusing on improving stride length and gait speed. He did well with verbal cues as he began to pick feet up with good foot clearance when taking bigger strides and walking at faster rate. I later had him walk another long bout with dual tasking, which did result in him having difficulty keeping gait speed and stride length requiring intermittent verbal cues to stay on task. Reactive balance was performed today catching ball with step out to improve stepping strategy to regain balance. I also had him slide out flat cones laterally to improve reactive balance. He did have decreased balance with both activities, but improved with practice. Squats with ball pick up and place overhead performed to improve activity tolerance and improve ability to perform functional tasks at home and [place] Overall, patient continues to be very motivated with ultimate goal to walk 3 mile hike on his property.   OBJECTIVE IMPAIRMENTS: Abnormal gait, cardiopulmonary status limiting activity, decreased activity tolerance, decreased balance, decreased coordination, decreased endurance, decreased knowledge of condition, decreased knowledge of use of DME, decreased mobility, difficulty walking, decreased ROM, decreased strength, decreased safety awareness, dizziness, impaired flexibility, impaired  sensation, and pain.   ACTIVITY LIMITATIONS: carrying, lifting, bending, standing, squatting, stairs, transfers, locomotion level, and caring for others  PARTICIPATION LIMITATIONS: cleaning, laundry, driving, shopping, community activity, and yard work  PERSONAL FACTORS: Age, Behavior pattern, and 3+ comorbidities:   are also affecting patient's functional outcome.   REHAB POTENTIAL: Excellent  CLINICAL DECISION MAKING: Evolving/moderate complexity  EVALUATION COMPLEXITY: Moderate  PLAN:  PT FREQUENCY: 1-2x/week  PT DURATION: 12 weeks  PLANNED INTERVENTIONS: 97164- PT Re-evaluation, 97750- Physical Performance Testing, 97110-Therapeutic exercises, 97530- Therapeutic activity, 97112- Neuromuscular re-education, 97535- Self Care, [id number]- Manual therapy, [phone]- Gait training, Patient/Family education, Balance training, Stair training, Joint mobilization, Joint manipulation, Vestibular training, Visual/preceptual remediation/compensation, DME instructions, Cryotherapy, and Moist heat  PLAN FOR NEXT SESSION:    4:16 PM, 05/12/24 Norman Sharps, PT, DPT Physical Therapist - [facility]  Outpatient Physical Therapy- [facility] [phone]

## 2024-05-16 ENCOUNTER — Ambulatory Visit

## 2024-05-16 DIAGNOSIS — G5793 Unspecified mononeuropathy of bilateral lower limbs: Secondary | ICD-10-CM

## 2024-05-16 DIAGNOSIS — R2689 Other abnormalities of gait and mobility: Secondary | ICD-10-CM

## 2024-05-16 DIAGNOSIS — R262 Difficulty in walking, not elsewhere classified: Secondary | ICD-10-CM

## 2024-05-16 DIAGNOSIS — R269 Unspecified abnormalities of gait and mobility: Secondary | ICD-10-CM

## 2024-05-16 NOTE — Therapy (Signed)
 OUTPATIENT PHYSICAL THERAPY TREATMENT  Patient Name: Evan Forbes. MRN: [id number] DOB:Mar 14, 1932, 89 y.o., male Today's Date: 05/16/2024  PCP: Marsa Reyes Lenis, MD  REFERRING PROVIDER:   Maree Jannett POUR, MD    END OF SESSION:   PT End of Session - 05/16/24 1442     Visit Number 13    Number of Visits 24    Date for Recertification  06/22/24    Authorization Type Medicare A & B; Generic [employer] secondary    Progress Note Due on Visit 10    PT Start Time 1442    PT Stop Time 1529    PT Time Calculation (min) 47 min    Equipment Utilized During Treatment Gait belt    Activity Tolerance Patient tolerated treatment well;No increased pain;Patient limited by fatigue    Behavior During Therapy [facility] for tasks assessed/performed           Past Medical History:  Diagnosis Date   Cancer (HCC)    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  Rationale for Evaluation and Treatment: Rehabilitation  SUBJECTIVE:                                                                                                                                                                                             SUBJECTIVE STATEMENT: Pt reports doing okay today, but states that his walking is a little off balance and he is unsure why. He reports that he has been sitting a little more than usual the past couple days, but overall has no new complaints.  PERTINENT HISTORY:  89yoM referred to OPPT for unsteadiness, imbalance 2/2 neuropathy. PMH: Hypertension, Arrhythmia, GERD, Glaucoma, Chronic kidney disease with eGFR of 57,  History of B12 deficiency  PAIN:  Are you having  pain? No  PRECAUTIONS: Fall  WEIGHT BEARING RESTRICTIONS: No  FALLS: Has patient fallen in last 6 months? No and a few near falls but has had no full falls in >2 years   LIVING ENVIRONMENT: Lives with: lives alone Lives in: House/apartment Stairs: Yes: Internal: 18 steps; on right going up and on left going up and External: 2 steps; on left going up Has following equipment at home: Single point cane, Grab bars, and walking  stick or hiking pole   PLOF: Independent, Independent with basic ADLs, Independent with household mobility with device, Requires assistive device for independence, and walking stick   PATIENT GOALS:  Increased strength in BLE Improve balance and be able to walk in the dark   OBJECTIVE:  Note: Objective measures were completed at Evaluation unless otherwise noted.                                                                                                                            TREATMENT DATE: 05/16/24  -1000' ambulation with increased stride length and gait speed.  -Obstacle course - 3 Airex pads + (2) 6'' hurdles + over 6'' step.  -Step up on 6'' step + single leg march in // bars.  -tandem stance 3x30'' in // bars.  Durrell throw/catch on wall while standing Romberg on airex x40.  -Standing on Airex with perturbations x30 ->1000' ambualation with increased stride length and gait speed.   PATIENT EDUCATION: Education details: Pt educated on proper safety when ascending/descending steps/stairs at home.  Person educated: Patient Education method: Medical Illustrator Education comprehension: verbalized understanding and returned demonstration  HOME EXERCISE PROGRAM: Access Code: [email or web address] URL: [email or web address] Date: 03/31/2024 Prepared by: Massie Dollar  Exercises - Standing Tandem Balance with Counter Support  - 1 x daily - 3-4 x weekly - 3 sets - 10 reps - 30 seconds  hold - Standing March with Counter Support  - 1 x  daily - 3-4 x weekly - 3 sets - 10 reps - 2 seconds  hold - Standing Single Leg Stance with Counter Support  - 1 x daily - 3-4 x weekly - 3 sets - 10 reps - 5 seconds  hold - Sit to Stand with Armchair  - 1 x daily - 7 x weekly - 3 sets - 10 reps  GOALS: Goals reviewed with patient? Yes  SHORT TERM GOALS: Target date: 04/28/2024 Patient will be independent in home exercise program to improve strength/mobility for better functional independence with ADLs. Baseline: initiated 03/30/24 Goal status: Met. Pt demonstrates independence with current HEP.   LONG TERM GOALS: Target date: 06/23/2024  Patient will increase ABC scale score >80% to demonstrate better functional mobility and better confidence with ADLs.  Baseline: to be completed Goal status: Ongoing: 75%  2.  Patient (> 45 years old) will complete five times sit to stand test in <11 seconds indicating an increased LE strength and improved balance. Baseline: 14.09sec  05/04/2024: 10.07 sec Goal status: Met.   3.  Patient will increase Berg Balance score by > 6 points to demonstrate decreased fall risk during functional activities Baseline: 43 Goal status: Met: 51/56  4.  Patient will increase 10 meter walk test to >1.67m/s as to improve gait speed for better community ambulation and to reduce fall risk. Baseline:  0.84sec with walking stick Goal status: Met: 1.35 m/s  5.  Patient will reduce timed up and go to <11  seconds to reduce fall risk and demonstrate improved transfer/gait ability. Baseline: 05/04/24:10.7'' Goal status: MET  6.  Patient will increase FGA score to >22/30 as to demonstrate reduced fall risk and improved dynamic gait balance for better safety with community/home ambulation.  Baseline: to be completed  Goal status: INITIAL  7. Patient will demonstrate improved gait mechanics with good bilateral foot clearance during swing phase when ambulating 1000' without AD.          Baseline (05/04/2024): Patient begins  to have lack of foot clearance bilaterally when walking for prolonged distances.  Goal Status: Initial   8. Patient will demonstrate independence with exercise routine in the [facility] by discharge allowing him to transition his resistive and [facility] to continue improving strength/balance following PT discharge.   BASELINE (05/04/2024): Pt has yet to perform routine or receive instruction in [facility] and reports he really wishes to do so prior to discharge allowing him to go to local [facility] with improved confidence.   Goal Status: Initial  ASSESSMENT:  CLINICAL IMPRESSION: Patient began today with >1000' today with verbal cuing to increase stride length and gait speed for improved gait efficiency and increased cardiovascular response to warm-up. He then performed various dynamic and reactive balance exercises in // bars and on uneven surfaces. He did not perform as well today as he has in previous treatments for unknown reasons. He did however show significant improvement in tandem stance in // bars standing for 30'' without having to use upper extremities x2 of 6 trials. He finished treatment with more gait training to improve dynamic balance, gait speed, and gait efficiency. Overall, pt continues to be very motivated to improve throughout treatment session and will benefit from continuing PT at this time.   OBJECTIVE IMPAIRMENTS: Abnormal gait, cardiopulmonary status limiting activity, decreased activity tolerance, decreased balance, decreased coordination, decreased endurance, decreased knowledge of condition, decreased knowledge of use of DME, decreased mobility, difficulty walking, decreased ROM, decreased strength, decreased safety awareness, dizziness, impaired flexibility, impaired sensation, and pain.   ACTIVITY LIMITATIONS: carrying, lifting, bending, standing, squatting, stairs, transfers, locomotion level, and caring for others  PARTICIPATION LIMITATIONS: cleaning,  laundry, driving, shopping, community activity, and yard work  PERSONAL FACTORS: Age, Behavior pattern, and 3+ comorbidities:   are also affecting patient's functional outcome.   REHAB POTENTIAL: Excellent  CLINICAL DECISION MAKING: Evolving/moderate complexity  EVALUATION COMPLEXITY: Moderate  PLAN:  PT FREQUENCY: 1-2x/week  PT DURATION: 12 weeks  PLANNED INTERVENTIONS: 97164- PT Re-evaluation, 97750- Physical Performance Testing, 97110-Therapeutic exercises, 97530- Therapeutic activity, 97112- Neuromuscular re-education, 97535- Self Care, 02859- Manual therapy, [phone]- Gait training, Patient/Family education, Balance training, Stair training, Joint mobilization, Joint manipulation, Vestibular training, Visual/preceptual remediation/compensation, DME instructions, Cryotherapy, and Moist heat  PLAN FOR NEXT SESSION:    4:38 PM, 05/16/24 Norman Sharps, PT, DPT Physical Therapist - [facility]  Outpatient Physical Therapy- Main Campus [phone]

## 2024-05-18 ENCOUNTER — Ambulatory Visit

## 2024-05-19 ENCOUNTER — Ambulatory Visit

## 2024-05-20 ENCOUNTER — Ambulatory Visit: Payer: Self-pay | Admitting: Urology

## 2024-05-23 ENCOUNTER — Ambulatory Visit

## 2024-05-23 NOTE — Telephone Encounter (Signed)
 Called patient and lvm to call office to schedule appointment for follow up to discuss CT results.

## 2024-05-23 NOTE — Telephone Encounter (Signed)
 Pt called and LVM so called him back and got him schedueld.

## 2024-05-24 ENCOUNTER — Ambulatory Visit

## 2024-05-24 DIAGNOSIS — R2689 Other abnormalities of gait and mobility: Secondary | ICD-10-CM | POA: Diagnosis not present

## 2024-05-24 DIAGNOSIS — G5793 Unspecified mononeuropathy of bilateral lower limbs: Secondary | ICD-10-CM

## 2024-05-24 DIAGNOSIS — R262 Difficulty in walking, not elsewhere classified: Secondary | ICD-10-CM

## 2024-05-24 DIAGNOSIS — R269 Unspecified abnormalities of gait and mobility: Secondary | ICD-10-CM

## 2024-05-24 NOTE — Therapy (Signed)
 OUTPATIENT PHYSICAL THERAPY TREATMENT  Patient Name: Evan Forbes. MRN: [id number] DOB:[birth year], 89 y.o., male Today's Date: 05/24/2024  PCP: Marsa Reyes Lenis, MD  REFERRING PROVIDER:   Maree Jannett POUR, MD    END OF SESSION:   PT End of Session - 05/24/24 1704     Visit Number 14    Number of Visits 24    Date for Recertification  06/22/24    Authorization Type Medicare A & B; Generic [employer] secondary    Progress Note Due on Visit 10    PT Start Time 1615    PT Stop Time 1702    PT Time Calculation (min) 47 min    Equipment Utilized During Treatment Gait belt    Activity Tolerance Patient tolerated treatment well;No increased pain;Patient limited by fatigue    Behavior During Therapy [facility] for tasks assessed/performed            Past Medical History:  Diagnosis Date   Cancer (HCC)    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 He reports that he has been sitting a little more than usual the past couple days, but overall has no new complaints.  PERTINENT HISTORY:  89yoM referred to OPPT for unsteadiness, imbalance 2/2 neuropathy. PMH: Hypertension, Arrhythmia,  GERD, Glaucoma, Chronic kidney disease with eGFR of 57,  History of B12 deficiency  PAIN:  Are you having pain? No  PRECAUTIONS: Fall  WEIGHT BEARING RESTRICTIONS: No  FALLS: Has patient fallen in last 6 months? No and a few near falls but has had no full falls in >2 years   LIVING ENVIRONMENT: Lives with: lives alone Lives in: House/apartment Stairs: Yes: Internal: 18 steps; on right going up and on left going up and External: 2  steps; on left going up Has following equipment at home: Single point cane, Grab bars, and walking stick or hiking pole   PLOF: Independent, Independent with basic ADLs, Independent with household mobility with device, Requires assistive device for independence, and walking stick   PATIENT GOALS:  Increased strength in BLE Improve balance and be able to walk in the dark   OBJECTIVE:  Note: Objective measures were completed at Evaluation unless otherwise noted.                                                                                                                            TREATMENT DATE: 05/24/2024  -1350' ambulation with increased stride length and gait speed.  -Narrow BOS with EC 3x30''  -Airex with perturbations x30.  -Standing marches on Airex x20 each.  -Physioball bounce x30 while standing NBOS on Airex.  -900' with head turns and random sudden stops.  -Stairs in stair well x23 steps no railing with ascent and one railing with descent.    PATIENT EDUCATION: Education details: Pt educated on proper safety when ascending/descending steps/stairs at home.  Person educated: Patient Education method: Medical Illustrator Education comprehension: verbalized understanding and returned demonstration  HOME EXERCISE PROGRAM: Access Code: [email or web address] URL: [email or web address] Date: 03/31/2024 Prepared by: Massie Dollar  Exercises - Standing Tandem Balance with Counter Support  - 1 x daily - 3-4 x weekly - 3 sets - 10  reps - 30 seconds  hold - Standing March with Counter Support  - 1 x daily - 3-4 x weekly - 3 sets - 10 reps - 2 seconds  hold - Standing Single Leg Stance with Counter Support  - 1 x daily - 3-4 x weekly - 3 sets - 10 reps - 5 seconds  hold - Sit to Stand with Armchair  - 1 x daily - 7 x weekly - 3 sets - 10 reps  GOALS: Goals reviewed with patient? Yes  SHORT TERM GOALS: Target date: 04/28/2024 Patient will be independent in home exercise program to improve strength/mobility for better functional independence with ADLs. Baseline: initiated 03/30/24 Goal status: Met. Pt demonstrates independence with current HEP.   LONG TERM GOALS: Target date: 06/23/2024  Patient will increase ABC scale score >80% to demonstrate better functional mobility and better confidence with ADLs.  Baseline: to be completed Goal status: Ongoing: 75%  2.  Patient (> 6 years old) will complete five times sit to stand test in <11 seconds indicating an increased LE strength and improved balance. Baseline: 14.09sec  05/04/2024: 10.07 sec Goal status: Met.   3.  Patient will increase Berg Balance score by > 6 points to demonstrate decreased fall risk during functional activities Baseline: 43 Goal status: Met: 51/56  4.  Patient will increase 10 meter walk test to >1.23m/s as to improve gait speed for better community ambulation and to reduce fall risk. Baseline:  0.84sec with walking stick Goal status: Met: 1.35 m/s  5.  Patient  will reduce timed up and go to <11 seconds to reduce fall risk and demonstrate improved transfer/gait ability. Baseline: 05/04/24:10.7'' Goal status: MET  6.  Patient will increase FGA score to >22/30 as to demonstrate reduced fall risk and improved dynamic gait balance for better safety with community/home ambulation.  Baseline: to be completed  Goal status: INITIAL  7. Patient will demonstrate improved gait mechanics with good bilateral foot clearance during swing phase when  ambulating 1000' without AD.          Baseline (05/04/2024): Patient begins to have lack of foot clearance bilaterally when walking for prolonged distances.  Goal Status: Initial   8. Patient will demonstrate independence with exercise routine in the [facility] by discharge allowing him to transition his resistive and aerobic [facility] to continue improving strength/balance following PT discharge.   BASELINE (05/04/2024): Pt has yet to perform routine or receive instruction in [facility] and reports he really wishes to do so prior to discharge allowing him to [facility] to [facility] with improved confidence.   Goal Status: Initial  ASSESSMENT:  CLINICAL IMPRESSION: Patient began today with >1000' today with verbal cuing to increase stride length and gait speed for improved gait efficiency and increased cardiovascular response to warm-up. He then performed various dynamic and reactive balance exercises at balance station. He continues to have most difficulty losing balance backward compared to forward. He was noted to rely mostly on vision for balance as he had much more difficulty maintaining balance even on stable surface. Pt did add head turns along with sudden random stop commands with gait today for improved balance with gait. Stairs were also performed in stairwell which patient was noted to have decreased confidence and speed when performing stairs without UE support. Overall, patient will still benefit from skilled PT and is very motivated to improve throughout his POC.   OBJECTIVE IMPAIRMENTS: Abnormal gait, cardiopulmonary status limiting activity, decreased activity tolerance, decreased balance, decreased coordination, decreased endurance, decreased knowledge of condition, decreased knowledge of use of DME, decreased mobility, difficulty walking, decreased ROM, decreased strength, decreased safety awareness, dizziness, impaired flexibility, impaired sensation, and pain.   ACTIVITY  LIMITATIONS: carrying, lifting, bending, standing, squatting, stairs, transfers, locomotion level, and caring for others  PARTICIPATION LIMITATIONS: cleaning, laundry, driving, shopping, community activity, and yard work  PERSONAL FACTORS: Age, Behavior pattern, and 3+ comorbidities:   are also affecting patient's functional outcome.   REHAB POTENTIAL: Excellent  CLINICAL DECISION MAKING: Evolving/moderate complexity  EVALUATION COMPLEXITY: Moderate  PLAN:  PT FREQUENCY: 1-2x/week  PT DURATION: 12 weeks  PLANNED INTERVENTIONS: 97164- PT Re-evaluation, 97750- Physical Performance Testing, 97110-Therapeutic exercises, 97530- Therapeutic activity, 97112- Neuromuscular re-education, 97535- Self Care, 02859- Manual therapy, [phone]- Gait training, Patient/Family education, Balance training, Stair training, Joint mobilization, Joint manipulation, Vestibular training, Visual/preceptual remediation/compensation, DME instructions, Cryotherapy, and Moist heat  PLAN FOR NEXT SESSION:  -Continue working on dynamic and reactive balance with higher level balance activities.   5:10 PM, 05/24/2024 Norman Sharps, PT, DPT Physical Therapist - [facility]  Outpatient Physical Therapy- Main Campus [phone]

## 2024-05-25 ENCOUNTER — Ambulatory Visit

## 2024-05-25 ENCOUNTER — Encounter: Payer: Self-pay | Admitting: Urology

## 2024-05-25 ENCOUNTER — Ambulatory Visit (INDEPENDENT_AMBULATORY_CARE_PROVIDER_SITE_OTHER): Admitting: Urology

## 2024-05-25 VITALS — BP 164/75 | HR 68 | Ht 72.0 in | Wt 210.0 lb

## 2024-05-25 DIAGNOSIS — R3129 Other microscopic hematuria: Secondary | ICD-10-CM

## 2024-05-25 NOTE — Progress Notes (Signed)
 "  05/25/2024 2:04 PM   Evan VEAR Canter Jr. Dec 31, 1931 [id number]  Referring provider: Marsa Reyes Lenis, MD [id number] [place],  [place] [address]  Chief Complaint  Patient presents with   Follow-up   Results    HPI: Evan Bollig. is a 89 y.o. male who presents to review CT results.  Refer to my prior note 04/27/2024 CT urogram showed bilateral, small nonobstructing renal calculi and bilateral simple renal cyst.  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 If you have any questions, ask your nurse or doctor.          alendronate 70 MG tablet Commonly known as: FOSAMAX Take 70 mg by mouth.   ALPRAZolam 0.25 MG tablet Commonly known as: XANAX For sleep use 1/2 as needed   aspirin EC 81 MG tablet Take 325 mg by mouth.   clotrimazole-betamethasone cream Commonly known as: LOTRISONE Apply topically 2 (two) times daily.   cyanocobalamin 1000 MCG tablet Commonly known as: VITAMIN B12 Take 1,000 mcg by mouth.   DSS 100 MG Caps Take 100 mg by mouth.   DULoxetine 30 MG capsule Commonly known as: CYMBALTA Take 30 mg by mouth.   famotidine 20 MG tablet Commonly known as: PEPCID Take 20 mg by mouth.   finasteride 5 MG tablet Commonly known as: PROSCAR Take 5 mg by mouth.   hydrochlorothiazide 25 MG tablet Commonly known as: HYDRODIURIL Take 25 mg by mouth daily.   losartan 100 MG tablet Commonly known as: COZAAR Take 100 mg by mouth.   metroNIDAZOLE 0.75 %  gel Commonly known as: METROGEL Apply topically.   pantoprazole 20 MG tablet Commonly known as: PROTONIX Take 20 mg by mouth daily.   potassium chloride 10 MEQ tablet Commonly known as: KLOR-CON Take 10 mEq by mouth.   sodium chloride  0.65 % nasal spray Commonly known as: OCEAN   tamsulosin 0.4 MG Caps capsule Commonly known as: FLOMAX Take 0.8 mg by mouth.        Allergies: Allergies[1]  Family History: No family history on file.  Social History:  reports that he does not currently use alcohol. No history on file for tobacco use and drug use.   Physical Exam: BP (!) 164/75   Pulse 68   Ht 6' (1.829 m)   Wt 210 lb (95.3 kg)   BMI 28.48 kg/m   Constitutional:  Alert, No acute distress. HEENT: [place] AT Respiratory: Normal respiratory effort, no increased work of breathing. GU: Prostate 40 g, smooth without nodules.  No firmness or induration base left prostate Psychiatric: Normal mood and affect.   Pertinent Imaging: CT images were personally reviewed and interpreted  CT HEMATURIA WORKUP  Narrative CLINICAL DATA:  Microscopic hematuria  EXAM: CT ABDOMEN AND PELVIS WITHOUT AND WITH CONTRAST  TECHNIQUE: Multidetector CT imaging of the abdomen and pelvis was performed  following the standard protocol before and following the bolus administration of intravenous contrast. The patient was unable to tolerate prone positioning.  RADIATION DOSE REDUCTION: This exam was performed according to the departmental dose-optimization program which includes automated exposure control, adjustment of the mA and/or kV according to patient size and/or use of iterative reconstruction technique.  CONTRAST:  100mL OMNIPAQUE  IOHEXOL  300 MG/ML  SOLN  COMPARISON:  None Available.  FINDINGS: Lower chest: No focal consolidation or pulmonary nodule in the lung bases. No pleural effusion or pneumothorax demonstrated. Partially imaged heart size is normal.  Hepatobiliary: 2.0 cm  segment 6 cyst (2:19). Subcentimeter peripheral segment 2 enhancing focus (7:19), likely a venous malformation or perfusional variation. No intra or extrahepatic biliary ductal dilation. Cholecystectomy.  Pancreas: 1.4 cm cystic focus in the pancreatic tail (10:45). No main ductal dilation, mass lesion, or abnormal enhancement.  Spleen: Normal in size without focal abnormality.  Adrenals/Urinary Tract: No adrenal nodules. Simple left upper pole cyst measures 9.5 cm. Simple exophytic right upper pole renal cyst measures 1.6 cm. No specific follow-up imaging recommended. No hydronephrosis. Punctate bilateral nonobstructing stones. No suspicious filling defect visualized within the opacified portions of the collecting systems or ureters on delayed imaging. Trabeculated urinary bladder with multiple small diverticuli.  Stomach/Bowel: Normal appearance of the stomach. No evidence of bowel wall thickening, distention, or inflammatory changes. Colonic diverticulosis without acute diverticulitis. Dependent hyperdensity within the cecum, likely ingested. Appendix is not discretely seen.  Vascular/Lymphatic: Aortic atherosclerosis. No enlarged abdominal or pelvic lymph nodes.  Reproductive: Enlargement of the prostate with median lobe hypertrophy. Central defect within the prostate gland, which may be postsurgical. Asymmetric fullness and hyperdensity of the left seminal vesicle (2:72).  Other: No free fluid, fluid collection, or free air.  Musculoskeletal: No acute or abnormal lytic or blastic osseous lesions. Multilevel degenerative changes of the partially imaged thoracic and lumbar spine. Small fat-containing bilateral inguinal hernias.  IMPRESSION: 1. Punctate bilateral nonobstructing renal stones. No hydronephrosis. 2. Trabeculated urinary bladder with multiple small diverticuli, likely sequela of chronic outlet obstruction by the enlarged prostate gland. Central defect within  the prostate gland, which may be postsurgical. 3. Asymmetric fullness and hyperdensity of the left seminal vesicle, which is nonspecific. Recommend correlation with PSA levels. 4. A 1.4 cm cystic focus in the pancreatic tail, likely a side branch IPMN. No main ductal dilation, mass lesion, or abnormal enhancement. Follow-up imaging in 2 years can be considered with contrast-enhanced MRI/MRCP if this recommendation aligns with the patient's goals of care. 5.  Aortic Atherosclerosis (ICD10-I70.0).   Electronically Signed By: Limin  Xu M.D. On: 05/13/2024 08:24   Assessment & Plan:    1.  Microhematuria Small, bilateral renal calculi: Upper tract imaging.  No renal mass We discussed the recommendation of cystoscopy for lower tract evaluation however he elects to hold off unless he has worsening microhematuria or gross hematuria 3-month follow-up with UA  2.  Enlargement left seminal vesicle Options discussed including observation based on age.  Pros and cons of PSA checked in his age group were discussed.  Will discuss further with partners and get back to patient   Evan JAYSON Barba, MD  [facility] [address] [place], [place] [address] [phone]     [1]  Allergies Allergen Reactions   Alendronate Sodium    "

## 2024-05-26 ENCOUNTER — Ambulatory Visit

## 2024-05-26 DIAGNOSIS — R2689 Other abnormalities of gait and mobility: Secondary | ICD-10-CM

## 2024-05-26 DIAGNOSIS — R262 Difficulty in walking, not elsewhere classified: Secondary | ICD-10-CM

## 2024-05-26 DIAGNOSIS — G5793 Unspecified mononeuropathy of bilateral lower limbs: Secondary | ICD-10-CM

## 2024-05-26 DIAGNOSIS — R269 Unspecified abnormalities of gait and mobility: Secondary | ICD-10-CM

## 2024-05-26 NOTE — Therapy (Unsigned)
 OUTPATIENT PHYSICAL THERAPY TREATMENT  Patient Name: Evan Forbes. MRN: [id number] DOB:11-May-1932, 89 y.o., male Today's Date: 05/26/2024  PCP: Marsa Reyes Lenis, MD  REFERRING PROVIDER:   Maree Jannett POUR, MD    END OF SESSION:   PT End of Session - 05/26/24 1504     Visit Number 15    Number of Visits 24    Date for Recertification  06/22/24    Authorization Type Medicare A & B; Generic [employer] secondary    Progress Note Due on Visit 10    PT Start Time 1445    Equipment Utilized During Treatment Gait belt    Activity Tolerance Patient tolerated treatment well;No increased pain;Patient limited by fatigue    Behavior During Therapy [facility] for tasks assessed/performed            Past Medical History:  Diagnosis Date   Cancer (HCC)    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  Rationale for Evaluation and Treatment: Rehabilitation  SUBJECTIVE:                                                                                                                                                                                             SUBJECTIVE STATEMENT: Pt reports doing okay today, but states that his walking is a little off balance and he is unsure why. He reports that he has been sitting a little more than usual the past couple days, but overall has no new complaints.  PERTINENT HISTORY:  89yoM referred to OPPT for unsteadiness, imbalance 2/2 neuropathy. PMH: Hypertension, Arrhythmia, GERD, Glaucoma, Chronic kidney disease with eGFR of 57,  History of B12 deficiency  PAIN:  Are you having pain? No  PRECAUTIONS: Fall  WEIGHT BEARING  RESTRICTIONS: No  FALLS: Has patient fallen in last 6 months? No and a few near falls but has had no full falls in >2 years   LIVING ENVIRONMENT: Lives with: lives alone Lives in: House/apartment Stairs: Yes: Internal: 18 steps; on right going up and on left going up and External: 2 steps; on left going up Has following equipment at home: Single point cane, Grab bars, and walking stick or hiking pole   PLOF: Independent, Independent with basic ADLs, Independent with household  mobility with device, Requires assistive device for independence, and walking stick   PATIENT GOALS:  Increased strength in BLE Improve balance and be able to walk in the dark   OBJECTIVE:  Note: Objective measures were completed at Evaluation unless otherwise noted.                                                                                                                            TREATMENT DATE: 05/26/2024      -Obstacle course: Foam airex x3, wobble board, 6'' step, 1/2 roll. X10 -Side stepping on 3 airex + wobble board in // bars x5 laps.  -Step up onto and over 6'' step reciprocally x10 each.   -Gait: 900' verbal cues to improve bilateral foot clearance.  -SLS: 4x30'' each.  -Tandem stance on foam: 4x30'' each.    PATIENT EDUCATION: Education details: Pt educated on proper safety when ascending/descending steps/stairs at home.  Person educated: Patient Education method: Medical Illustrator Education comprehension: verbalized understanding and returned demonstration  HOME EXERCISE PROGRAM: Access Code: [email or web address] URL: [email or web address] Date: 03/31/2024 Prepared by: Massie Dollar  Exercises - Standing Tandem Balance with Counter Support  - 1 x daily - 3-4 x weekly - 3 sets - 10 reps - 30 seconds  hold - Standing March with Counter Support  - 1 x daily - 3-4 x weekly - 3 sets - 10 reps - 2 seconds  hold - Standing Single Leg Stance with Counter Support  - 1 x  daily - 3-4 x weekly - 3 sets - 10 reps - 5 seconds  hold - Sit to Stand with Armchair  - 1 x daily - 7 x weekly - 3 sets - 10 reps  GOALS: Goals reviewed with patient? Yes  SHORT TERM GOALS: Target date: 04/28/2024 Patient will be independent in home exercise program to improve strength/mobility for better functional independence with ADLs. Baseline: initiated 03/30/24 Goal status: Met. Pt demonstrates independence with current HEP.   LONG TERM GOALS: Target date: 06/23/2024  Patient will increase ABC scale score >80% to demonstrate better functional mobility and better confidence with ADLs.  Baseline: to be completed Goal status: Ongoing: 75%  2.  Patient (> 20 years old) will complete five times sit to stand test in <11 seconds indicating an increased LE strength and improved balance. Baseline: 14.09sec  05/04/2024: 10.07 sec Goal status: Met.   3.  Patient will increase Berg Balance score by > 6 points to demonstrate decreased fall risk during functional activities Baseline: 43 Goal status: Met: 51/56  4.  Patient will increase 10 meter walk test to >1.71m/s as to improve gait speed for better community ambulation and to reduce fall risk. Baseline:  0.84sec with walking stick Goal status: Met: 1.35 m/s  5.  Patient will reduce timed up and go to <11 seconds to reduce fall risk and demonstrate improved transfer/gait ability. Baseline: 05/04/24:10.7'' Goal status: MET  6.  Patient will increase FGA score  to >22/30 as to demonstrate reduced fall risk and improved dynamic gait balance for better safety with community/home ambulation.  Baseline: to be completed  Goal status: INITIAL  7. Patient will demonstrate improved gait mechanics with good bilateral foot clearance during swing phase when ambulating 1000' without AD.          Baseline (05/04/2024): Patient begins to have lack of foot clearance bilaterally when walking for prolonged distances.  Goal Status: Initial   8.  Patient will demonstrate independence with exercise routine in the [facility] by discharge allowing him to transition his resist[facility] and [facility] to continue improving strength/balance following PT discharge.   BASELINE (05/04/2024): Pt has yet to perform routine or receive instruction in [facility] and reports he really wishes to do so prior to discharge allowing him to [facility] to [facility] with improved confidence.   Goal Status: Initial  ASSESSMENT:  CLINICAL IMPRESSION: Patient began today with >1000' today with verbal cuing to increase stride length and gait speed for improved gait efficiency and increased cardiovascular response to warm-up. He then performed various dynamic and reactive balance exercises at balance station. He continues to have most difficulty losing balance backward compared to forward. He was noted to rely mostly on vision for balance as he had much more difficulty maintaining balance even on stable surface. Pt did add head turns along with sudden random stop commands with gait today for improved balance with gait. Stairs were also performed in stairwell which patient was noted to have decreased confidence and speed when performing stairs without UE support. Overall, patient will still benefit from skilled PT and is very motivated to improve throughout his POC.   OBJECTIVE IMPAIRMENTS: Abnormal gait, cardiopulmonary status limiting activity, decreased activity tolerance, decreased balance, decreased coordination, decreased endurance, decreased knowledge of condition, decreased knowledge of use of DME, decreased mobility, difficulty walking, decreased ROM, decreased strength, decreased safety awareness, dizziness, impaired flexibility, impaired sensation, and pain.   ACTIVITY LIMITATIONS: carrying, lifting, bending, standing, squatting, stairs, transfers, locomotion level, and caring for others  PARTICIPATION LIMITATIONS: cleaning, laundry, driving, shopping,  community activity, and yard work  PERSONAL FACTORS: Age, Behavior pattern, and 3+ comorbidities:   are also affecting patient's functional outcome.   REHAB POTENTIAL: Excellent  CLINICAL DECISION MAKING: Evolving/moderate complexity  EVALUATION COMPLEXITY: Moderate  PLAN:  PT FREQUENCY: 1-2x/week  PT DURATION: 12 weeks  PLANNED INTERVENTIONS: 97164- PT Re-evaluation, 97750- Physical Performance Testing, 97110-Therapeutic exercises, 97530- Therapeutic activity, 97112- Neuromuscular re-education, 97535- Self Care, [id number]- Manual therapy, [phone]- Gait training, Patient/Family education, Balance training, Stair training, Joint mobilization, Joint manipulation, Vestibular training, Visual/preceptual remediation/compensation, DME instructions, Cryotherapy, and Moist heat  PLAN FOR NEXT SESSION:  -Continue working on dynamic and reactive balance with higher level balance activities.   5:42 PM, 05/26/2024 Norman Sharps, PT, DPT Physical Therapist - [facility]  Outpatient Physical Therapy- [facility] [phone]

## 2024-05-30 ENCOUNTER — Ambulatory Visit

## 2024-05-30 ENCOUNTER — Ambulatory Visit: Admitting: Physical Therapy

## 2024-05-30 DIAGNOSIS — R269 Unspecified abnormalities of gait and mobility: Secondary | ICD-10-CM

## 2024-05-30 DIAGNOSIS — R2689 Other abnormalities of gait and mobility: Secondary | ICD-10-CM | POA: Diagnosis not present

## 2024-05-30 DIAGNOSIS — R262 Difficulty in walking, not elsewhere classified: Secondary | ICD-10-CM

## 2024-05-30 DIAGNOSIS — G5793 Unspecified mononeuropathy of bilateral lower limbs: Secondary | ICD-10-CM

## 2024-05-30 NOTE — Therapy (Unsigned)
 " OUTPATIENT PHYSICAL THERAPY TREATMENT  Patient Name: Evan Forbes. MRN: [id number] DOB:02/12/1932, 89 y.o., male 25 Date: 05/30/2024  PCP: Marsa Reyes Lenis, MD  REFERRING PROVIDER:   Maree Jannett POUR, MD    END OF SESSION:   PT End of Session - 05/30/24 1619     Visit Number 16    Number of Visits 24    Date for Recertification  06/22/24    Authorization Type Medicare A & B; Generic [employer] secondary    Progress Note Due on Visit 10    PT Start Time 1619    PT Stop Time 1700    PT Time Calculation (min) 41 min    Equipment Utilized During Treatment Gait belt    Activity Tolerance Patient tolerated treatment well;No increased pain;Patient limited by fatigue    Behavior During Therapy [facility] for tasks assessed/performed            Past Medical History:  Diagnosis Date   Cancer (HCC)    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 No falls or issues over the weekend. Still feels like he had a small regression over the last few weeks.    PERTINENT HISTORY:  89yoM referred to OPPT for unsteadiness, imbalance 2/2 neuropathy. PMH: Hypertension, Arrhythmia, GERD, Glaucoma, Chronic kidney disease with eGFR of 57,  History of B12  deficiency  PAIN:  Are you having pain? No  PRECAUTIONS: Fall  WEIGHT BEARING RESTRICTIONS: No  FALLS: Has patient fallen in last 6 months? No and a few near falls but has had no full falls in >2 years   LIVING ENVIRONMENT: Lives with: lives alone Lives in: House/apartment Stairs: Yes: Internal: 18 steps; on right going up and on left going up and External: 2 steps; on left going up Has following equipment at home: Single point cane, Grab  bars, and walking stick or hiking pole   PLOF: Independent, Independent with basic ADLs, Independent with household mobility with device, Requires assistive device for independence, and walking stick   PATIENT GOALS:  Increased strength in BLE Improve balance and be able to walk in the dark   OBJECTIVE:  Note: Objective measures were completed at Evaluation unless otherwise noted.                                                                                                                            TREATMENT DATE: 05/30/2024   Sit<>stand with BLE on airex pad x 10  Sit<>stand with BLE on airex pad x 10 with 2KG ball.   Blaze pods : Random session 1 pod on each step. Forward stair ascent/reverse descnt. Performed x 1:77min; performed x 3 bouts: 10 hits; 16 hits, 14 hits.  Random session 1 pod on each step. Side stepping up and down steps Performed x 1:34min for 2 bouts; 13 hits leading with the RLE, 11 hits leading with LLE.  Intermittent min assist with reverse stepping down steps and with ascent leading with the LLE laterally.   matrix machine resisted gait over 2 canes and 2 half bolsters  Forward 12.5 lbs x 4 mod assist on first bout to prevent lateral LOB to the L when transitioning to rever to return to starting position  Reverse 7.5lbs x 2; 12.5lbs x2 min-mod assist over the first obstacles.   Throughout session CGA provided by PT except when listed above. Multiple seated therapeutic rest breaks provided to prevent excessive fatigue  and improve safety with dynamic balance training.    PATIENT EDUCATION: Education details: Pt educated on proper safety when ascending/descending steps/stairs at home.  Person educated: Patient Education method: Medical Illustrator Education comprehension: verbalized understanding and returned demonstration  HOME EXERCISE PROGRAM: Access Code: [email or web address] URL: [email or web address] Date: 03/31/2024 Prepared by: Massie Dollar  Exercises - Standing Tandem Balance with Counter Support  - 1 x daily - 3-4 x weekly - 3 sets - 10 reps - 30 seconds  hold - Standing March with Counter Support  - 1 x daily - 3-4 x weekly - 3 sets - 10 reps - 2 seconds  hold - Standing Single Leg Stance with Counter Support  - 1 x daily - 3-4 x weekly - 3 sets - 10 reps - 5 seconds  hold - Sit to Stand with Armchair  - 1 x daily - 7 x weekly - 3 sets - 10 reps  GOALS: Goals reviewed with patient? Yes  SHORT TERM GOALS: Target date: 04/28/2024 Patient will be independent in home exercise program to improve strength/mobility for better functional independence with ADLs. Baseline: initiated 03/30/24 Goal status: Met. Pt demonstrates independence with current HEP.   LONG TERM GOALS: Target date: 06/23/2024  Patient will increase ABC scale score >80% to demonstrate better functional mobility and better confidence with ADLs.  Baseline: to be completed Goal status: Ongoing: 75%  2.  Patient (> 28 years old) will complete five times sit to stand test in <11 seconds indicating an increased LE strength and improved balance. Baseline: 14.09sec  05/04/2024: 10.07 sec Goal status: Met.   3.  Patient will increase Berg Balance score by > 6 points to demonstrate decreased fall risk during functional activities Baseline: 43 Goal status: Met: 51/56  4.  Patient will increase 10 meter walk test to >1.34m/s as to improve gait speed for better community ambulation and to reduce fall risk. Baseline:   0.84sec with walking stick Goal status: Met: 1.35 m/s  5.  Patient will reduce timed up and go to <11 seconds to reduce fall risk and demonstrate improved transfer/gait ability. Baseline: 05/04/24:10.7'' Goal status: MET  6.  Patient will increase FGA score to >22/30 as to demonstrate reduced fall risk and improved dynamic gait balance for better safety with community/home ambulation.  Baseline: to be completed  Goal status: INITIAL  7. Patient will demonstrate improved gait mechanics with good bilateral foot clearance during swing phase when ambulating 1000' without AD.          Baseline (05/04/2024): Patient begins to have lack of foot clearance bilaterally when walking for prolonged distances.  Goal Status: Initial   8. Patient will demonstrate independence with exercise routine in the [facility] by discharge allowing him to transition his resistive and [facility] to continue improving strength/balance following PT discharge.   BASELINE (05/04/2024): Pt has yet to perform routine or receive instruction in [facility] and reports he really wishes to do so prior to discharge allowing him to go to local [facility] with improved confidence.   Goal Status: Initial  ASSESSMENT:  CLINICAL IMPRESSION: PT arrives motivated to participate. PT treatment focused on functional strengthening and dynamic balance training with use of blaze pods on stairs and resisted gait over obstacles in floor. The Blaze Pod Random setting was chosen to enhance cognitive processing and agility, providing an unpredictable environment to simulate real-world scenarios, and fostering quick reactions and adaptability.  Was noted to have several near LOB requiring up to mod assist to prevent fall from LOB. Increased difficulty with stair ascent leading with the LLE and stepping over obstacles leading with LLE. Will benefit to continue to address LLE strength deficits and improved righting reactions with stance on the  LLE.  Overall, patient will still benefit from skilled PT and is very motivated to improve throughout his POC.   OBJECTIVE IMPAIRMENTS: Abnormal gait, cardiopulmonary status limiting activity, decreased activity tolerance, decreased balance, decreased coordination, decreased endurance, decreased knowledge of condition, decreased knowledge of use of DME, decreased mobility, difficulty walking, decreased ROM, decreased strength, decreased safety awareness, dizziness, impaired flexibility, impaired sensation, and pain.   ACTIVITY LIMITATIONS: carrying, lifting, bending, standing, squatting, stairs, transfers, locomotion level, and caring for others  PARTICIPATION LIMITATIONS: cleaning, laundry, driving, shopping, community activity, and yard work  PERSONAL FACTORS: Age, Behavior pattern, and 3+ comorbidities:   are also affecting patient's functional outcome.   REHAB POTENTIAL: Excellent  CLINICAL DECISION MAKING: Evolving/moderate complexity  EVALUATION COMPLEXITY: Moderate  PLAN:  PT FREQUENCY: 1-2x/week  PT DURATION: 12 weeks  PLANNED INTERVENTIONS: 97164- PT Re-evaluation, 97750- Physical Performance Testing, 97110-Therapeutic exercises, 97530- Therapeutic activity, 97112- Neuromuscular re-education, 97535- Self Care, [id number]- Manual therapy, [phone]- Gait training, Patient/Family education, Balance training, Stair training, Joint mobilization, Joint manipulation, Vestibular training, Visual/preceptual remediation/compensation, DME instructions, Cryotherapy, and Moist heat  PLAN FOR NEXT SESSION:   -Continue working on dynamic and  reactive balance with higher level balance activities.     Massie Dollar PT, DPT  Physical Therapist - [place]  [facility]  8:13 AM 05/31/2024      "

## 2024-06-04 ENCOUNTER — Encounter: Payer: Self-pay | Admitting: Urology

## 2024-06-06 ENCOUNTER — Ambulatory Visit: Admitting: Physical Therapy

## 2024-06-06 ENCOUNTER — Ambulatory Visit

## 2024-06-06 DIAGNOSIS — R2689 Other abnormalities of gait and mobility: Secondary | ICD-10-CM

## 2024-06-06 DIAGNOSIS — G5793 Unspecified mononeuropathy of bilateral lower limbs: Secondary | ICD-10-CM

## 2024-06-06 DIAGNOSIS — R269 Unspecified abnormalities of gait and mobility: Secondary | ICD-10-CM

## 2024-06-06 DIAGNOSIS — R262 Difficulty in walking, not elsewhere classified: Secondary | ICD-10-CM

## 2024-06-06 NOTE — Therapy (Signed)
 " OUTPATIENT PHYSICAL THERAPY TREATMENT  Patient Name: Evan Forbes. MRN: [id number] DOB:09-01-1931, 89 y.o., male Today's Date: 06/06/2024  PCP: Marsa Reyes Lenis, MD  REFERRING PROVIDER:   Maree Jannett POUR, MD    END OF SESSION:   PT End of Session - 06/06/24 1626     Visit Number 17    Number of Visits 24    Date for Recertification  06/22/24    Authorization Type Medicare A & B; Generic [employer] secondary    Progress Note Due on Visit 10    PT Start Time 1625    PT Stop Time 1705    PT Time Calculation (min) 40 min    Equipment Utilized During Treatment Gait belt    Activity Tolerance Patient tolerated treatment well;No increased pain;Patient limited by fatigue    Behavior During Therapy [facility] for tasks assessed/performed            Past Medical History:  Diagnosis Date   Cancer (HCC)    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 No falls or issues over the weekend. Still feels like he had a small regression over the last few weeks.    PERTINENT HISTORY:  89yoM referred to OPPT for unsteadiness, imbalance 2/2 neuropathy. PMH: Hypertension, Arrhythmia, GERD, Glaucoma, Chronic kidney disease with eGFR of 57,  History of B12  deficiency  PAIN:  Are you having pain? No  PRECAUTIONS: Fall  WEIGHT BEARING RESTRICTIONS: No  FALLS: Has patient fallen in last 6 months? No and a few near falls but has had no full falls in >2 years   LIVING ENVIRONMENT: Lives with: lives alone Lives in: House/apartment Stairs: Yes: Internal: 18 steps; on right going up and on left going up and External: 2 steps; on left going up Has following equipment at home: Single point cane, Grab  bars, and walking stick or hiking pole   PLOF: Independent, Independent with basic ADLs, Independent with household mobility with device, Requires assistive device for independence, and walking stick   PATIENT GOALS:  Increased strength in BLE Improve balance and be able to walk in the dark   OBJECTIVE:  Note: Objective measures were completed at Evaluation unless otherwise noted.                                                                                                                            TREATMENT DATE: 06/06/2024  Weighted gait with 5# AW 2 x 474ft.   Weighted side stepping 5# 69ft x 8 bil  Weighted reverse gait 5# 10 ft x 10  SLS at rail with 5# AW 10 sec x 8 bil Tandem at rail with intermittent UE support, 15 sec x 6 bil   Lateral step ups on 6inch step with 5# AW x 8 bil   Sit<>stand with BLE on airex pad x 10  Sit<>stand with BLE on airex pad x 10 with 2KG ball.   Gait without resistance  x490ft. Was noted to take increased step length and height for first 2-3 steps without AW in place, until able to adjust to movement pattern without resistance.    Throughout session CGA provided by PT except when listed above. Multiple seated therapeutic rest breaks provided to prevent excessive fatigue and improve safety with dynamic balance training.    PATIENT EDUCATION: Education details: Pt educated on proper safety when ascending/descending steps/stairs at home.  Person educated: Patient Education method: Software Engineer Education comprehension: verbalized understanding and returned demonstration  HOME EXERCISE PROGRAM: Access Code: [email or web address] URL: [email or web address] Date: 03/31/2024 Prepared by: Massie Dollar  Exercises - Standing Tandem Balance with Counter Support  - 1 x daily - 3-4 x weekly - 3 sets - 10 reps - 30 seconds  hold - Standing March with Counter Support  - 1 x daily - 3-4 x weekly - 3 sets - 10 reps - 2 seconds  hold - Standing Single Leg Stance with Counter Support  - 1 x daily - 3-4 x weekly - 3 sets - 10 reps - 5 seconds  hold - Sit to Stand with Armchair  - 1 x daily - 7 x weekly - 3 sets - 10 reps  GOALS: Goals reviewed with patient? Yes  SHORT TERM GOALS: Target date: 04/28/2024 Patient will be independent in home exercise program to improve strength/mobility for better functional independence with ADLs. Baseline: initiated 03/30/24 Goal status: Met. Pt demonstrates independence with current HEP.   LONG TERM GOALS: Target date: 06/23/2024  Patient will increase ABC scale score >80% to demonstrate better functional mobility and better confidence with ADLs.  Baseline: to be completed Goal status: Ongoing: 75%  2.  Patient (> 80 years old) will complete five times sit to stand test in <11 seconds indicating an increased LE strength and improved balance. Baseline: 14.09sec  05/04/2024: 10.07 sec Goal status: Met.   3.  Patient will increase Berg Balance score by > 6 points to demonstrate decreased fall risk during functional activities Baseline: 43 Goal status: Met: 51/56  4.  Patient will increase 10 meter walk test to >1.47m/s as to improve gait speed for better community ambulation and to reduce fall risk. Baseline:  0.84sec with walking stick Goal status: Met: 1.35 m/s  5.  Patient will reduce timed up and go to <11 seconds to reduce fall risk and demonstrate improved transfer/gait ability. Baseline: 05/04/24:10.7'' Goal status: MET  6.   Patient will increase FGA score to >22/30 as to demonstrate reduced fall risk and improved dynamic gait balance for better safety with community/home ambulation.  Baseline: to be completed  Goal status: INITIAL  7. Patient will demonstrate improved gait mechanics with good bilateral foot clearance during swing phase when ambulating 1000' without AD.          Baseline (05/04/2024): Patient begins to have lack of foot clearance bilaterally when walking for prolonged distances.  Goal Status: Initial   8. Patient will demonstrate independence with exercise routine in the [facility] by discharge allowing him to transition his resist[facility] and [facility] to continue improving strength/balance following PT discharge.   BASELINE (05/04/2024): Pt has yet to perform routine or receive instruction in [facility] and reports he really wishes to do so prior to discharge allowing him to go to local [facility] with improved confidence.   Goal Status: Initial  ASSESSMENT:  CLINICAL IMPRESSION: PT arrives motivated to participate. PT treatment focused on functional strengthening and dynamic movement patterns in weighted position to improve hip and ankle muscle activation. Pt states that he can feel his ankles better with AW in place. Continues to demonstrate increased difficulty with lateral step up on the L side compared to the R. Pt states that he feels slightly unsteady, with ambulation but only mild veer noted R and L.  Also states that he was able to perform SLS with AW in place better than compared to at home this AM. Overall, patient will still benefit from skilled PT and is very motivated to improve throughout his POC.   OBJECTIVE IMPAIRMENTS: Abnormal gait, cardiopulmonary status limiting activity, decreased activity tolerance, decreased balance, decreased coordination, decreased endurance, decreased knowledge of condition, decreased knowledge of use of DME, decreased mobility, difficulty walking,  decreased ROM, decreased strength, decreased safety awareness, dizziness, impaired flexibility, impaired sensation, and pain.   ACTIVITY LIMITATIONS: carrying, lifting, bending, standing, squatting, stairs, transfers, locomotion level, and caring for others  PARTICIPATION LIMITATIONS: cleaning, laundry, driving, shopping, community activity, and yard work  PERSONAL FACTORS: Age, Behavior pattern, and 3+ comorbidities:   are also affecting patient's functional outcome.   REHAB POTENTIAL: Excellent  CLINICAL DECISION MAKING: Evolving/moderate complexity  EVALUATION COMPLEXITY: Moderate  PLAN:  PT FREQUENCY: 1-2x/week  PT DURATION: 12 weeks  PLANNED INTERVENTIONS: 97164- PT Re-evaluation, 97750- Physical Performance Testing, 97110-Therapeutic exercises, 97530- Therapeutic activity, 97112- Neuromuscular re-education, 97535- Self Care, [id number]- Manual therapy, [phone]- Gait training, Patient/Family education, Balance training, Stair training, Joint mobilization, Joint manipulation, Vestibular training, Visual/preceptual remediation/compensation, DME instructions, Cryotherapy, and Moist heat  PLAN FOR NEXT SESSION:   -Continue working on dynamic and reactive balance with higher level balance activities.     Massie Dollar PT, DPT  Physical Therapist - [place]  [facility]  4:27 PM 06/06/2024      "

## 2024-06-07 NOTE — Therapy (Incomplete)
 " OUTPATIENT PHYSICAL THERAPY TREATMENT  Patient Name: Evan Forbes. 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  Rationale for Evaluation and Treatment: Rehabilitation  SUBJECTIVE:                                                                                                                                                                                             SUBJECTIVE STATEMENT: Pt reports doing okay today. No falls or issues over the weekend. Still feels like he had a small regression over the last few weeks.    PERTINENT HISTORY:  89yoM referred to OPPT for unsteadiness, imbalance 2/2 neuropathy. PMH: Hypertension, Arrhythmia, GERD, Glaucoma, Chronic kidney disease with eGFR of 57,  History of B12 deficiency  PAIN:  Are you having pain? No  PRECAUTIONS: Fall  WEIGHT BEARING RESTRICTIONS: No  FALLS: Has patient fallen in last 6 months? No and a few near falls but has had no full falls in >2 years   LIVING ENVIRONMENT: Lives with: lives alone Lives in: House/apartment Stairs: Yes: Internal: 18 steps; on right going up and on left going up and External: 2 steps; on left going up Has following equipment at home: Single point cane, Grab bars, and walking stick or hiking pole   PLOF: Independent, Independent with basic ADLs, Independent with household mobility with device, Requires assistive device for  independence, and walking stick   PATIENT GOALS:  Increased strength in BLE Improve balance and be able to walk in the dark   OBJECTIVE:  Note: Objective measures were completed at Evaluation unless otherwise noted.  TREATMENT DATE: 06/07/2024  Weighted gait with 5# AW 2 x 461ft.   Weighted side stepping 5# 52ft x 8 bil  Weighted reverse gait 5# 10 ft x 10  SLS at rail with 5# AW 10 sec x 8 bil Tandem at rail with intermittent UE support, 15 sec x 6 bil   Lateral step ups on 6inch step with 5# AW x 8 bil   Sit<>stand with BLE on airex pad x 10  Sit<>stand with BLE on airex pad x 10 with 2KG ball.   Gait without resistance  x474ft. Was noted to take increased step length and height for first 2-3 steps without AW in place, until able to adjust to movement pattern without resistance.    Throughout session CGA provided by PT except when listed above. Multiple seated therapeutic rest breaks provided to prevent excessive fatigue and improve safety with dynamic balance training.    PATIENT EDUCATION: Education details: Pt educated on proper safety when ascending/descending steps/stairs at home.  Person educated: Patient Education method: Medical Illustrator Education comprehension: verbalized understanding and returned demonstration  HOME EXERCISE PROGRAM: Access Code: [email or web address] URL: [email or web address] Date: 03/31/2024 Prepared by: Massie Dollar  Exercises - Standing Tandem Balance with Counter Support  - 1 x daily - 3-4 x weekly - 3 sets - 10 reps - 30 seconds  hold - Standing March with Counter Support  - 1 x daily - 3-4 x weekly - 3 sets - 10 reps - 2 seconds  hold - Standing Single Leg Stance with Counter Support  - 1 x daily - 3-4 x weekly - 3 sets - 10 reps - 5 seconds  hold - Sit to Stand with Armchair  - 1 x daily - 7 x  weekly - 3 sets - 10 reps  GOALS: Goals reviewed with patient? Yes  SHORT TERM GOALS: Target date: 04/28/2024 Patient will be independent in home exercise program to improve strength/mobility for better functional independence with ADLs. Baseline: initiated 03/30/24 Goal status: Met. Pt demonstrates independence with current HEP.   LONG TERM GOALS: Target date: 06/23/2024  Patient will increase ABC scale score >80% to demonstrate better functional mobility and better confidence with ADLs.  Baseline: to be completed Goal status: Ongoing: 75%  2.  Patient (> 33 years old) will complete five times sit to stand test in <11 seconds indicating an increased LE strength and improved balance. Baseline: 14.09sec  05/04/2024: 10.07 sec Goal status: Met.   3.  Patient will increase Berg Balance score by > 6 points to demonstrate decreased fall risk during functional activities Baseline: 43 Goal status: Met: 51/56  4.  Patient will increase 10 meter walk test to >1.64m/s as to improve gait speed for better community ambulation and to reduce fall risk. Baseline:  0.84sec with walking stick Goal status: Met: 1.35 m/s  5.  Patient will reduce timed up and go to <11 seconds to reduce fall risk and demonstrate improved transfer/gait ability. Baseline: 05/04/24:10.7'' Goal status: MET  6.  Patient will increase FGA score to >22/30 as to demonstrate reduced fall risk and improved dynamic gait balance for better safety with community/home ambulation.  Baseline: to be completed  Goal status: INITIAL  7. Patient will demonstrate improved gait mechanics with good bilateral foot clearance during swing phase when ambulating 1000' without AD.          Baseline (05/04/2024): Patient begins to have lack of foot clearance bilaterally when walking for prolonged distances.  Goal Status: Initial  8. Patient will demonstrate independence with exercise routine in the [facility] by discharge allowing him to  transition his resistive and [facility] to continue improving strength/balance following PT discharge.   BASELINE (05/04/2024): Pt has yet to perform routine or receive instruction in [facility] and reports he really wishes to do so prior to discharge allowing him to go to local Y with improved confidence.   Goal Status: Initial  ASSESSMENT:  CLINICAL IMPRESSION: PT arrives motivated to participate. PT treatment focused on functional strengthening and dynamic movement patterns in weighted position to improve hip and ankle muscle activation. Pt states that he can feel his ankles better with AW in place. Continues to demonstrate increased difficulty with lateral step up on the L side compared to the R. Pt states that he feels slightly unsteady, with ambulation but only mild veer noted R and L.  Also states that he was able to perform SLS with AW in place better than compared to at home this AM. Overall, patient will still benefit from skilled PT and is very motivated to improve throughout his POC.   OBJECTIVE IMPAIRMENTS: Abnormal gait, cardiopulmonary status limiting activity, decreased activity tolerance, decreased balance, decreased coordination, decreased endurance, decreased knowledge of condition, decreased knowledge of use of DME, decreased mobility, difficulty walking, decreased ROM, decreased strength, decreased safety awareness, dizziness, impaired flexibility, impaired sensation, and pain.   ACTIVITY LIMITATIONS: carrying, lifting, bending, standing, squatting, stairs, transfers, locomotion level, and caring for others  PARTICIPATION LIMITATIONS: cleaning, laundry, driving, shopping, community activity, and yard work  PERSONAL FACTORS: Age, Behavior pattern, and 3+ comorbidities:   are also affecting patient's functional outcome.   REHAB POTENTIAL: Excellent  CLINICAL DECISION MAKING: Evolving/moderate complexity  EVALUATION COMPLEXITY: Moderate  PLAN:  PT  FREQUENCY: 1-2x/week  PT DURATION: 12 weeks  PLANNED INTERVENTIONS: 97164- PT Re-evaluation, 97750- Physical Performance Testing, 97110-Therapeutic exercises, 97530- Therapeutic activity, 97112- Neuromuscular re-education, 97535- Self Care, [id number]- Manual therapy, [phone]- Gait training, Patient/Family education, Balance training, Stair training, Joint mobilization, Joint manipulation, Vestibular training, Visual/preceptual remediation/compensation, DME instructions, Cryotherapy, and Moist heat  PLAN FOR NEXT SESSION:   -Continue working on dynamic and reactive balance with higher level balance activities.     Chyrl London, PT Physical Therapist - [facility]  12:11 PM 06/07/2024      "

## 2024-06-08 ENCOUNTER — Ambulatory Visit

## 2024-06-08 DIAGNOSIS — G5793 Unspecified mononeuropathy of bilateral lower limbs: Secondary | ICD-10-CM

## 2024-06-08 DIAGNOSIS — R269 Unspecified abnormalities of gait and mobility: Secondary | ICD-10-CM

## 2024-06-08 DIAGNOSIS — R262 Difficulty in walking, not elsewhere classified: Secondary | ICD-10-CM

## 2024-06-08 DIAGNOSIS — R2689 Other abnormalities of gait and mobility: Secondary | ICD-10-CM | POA: Diagnosis not present

## 2024-06-08 NOTE — Therapy (Signed)
 " OUTPATIENT PHYSICAL THERAPY TREATMENT  Patient Name: Evan Forbes. MRN: [id number] DOB:March 22, 1932, 89 y.o., male Today's Date: 06/08/2024  PCP: Marsa Reyes Lenis, MD  REFERRING PROVIDER:   Maree Jannett POUR, MD    END OF SESSION:   PT End of Session - 06/08/24 1539     Visit Number 18    Number of Visits 24    Date for Recertification  06/22/24    Authorization Type Medicare A & B; Generic [employer] secondary    Progress Note Due on Visit 20    PT Start Time 1530    PT Stop Time 1610    PT Time Calculation (min) 40 min    Equipment Utilized During Treatment Gait belt    Activity Tolerance Patient tolerated treatment well;No increased pain;Patient limited by fatigue    Behavior During Therapy [facility] for tasks assessed/performed            Past Medical History:  Diagnosis Date   Cancer (HCC)    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 No falls or issues over the weekend. Has been working on single leg stance balance at home.   PERTINENT HISTORY:  89yoM referred to OPPT for unsteadiness, imbalance 2/2 neuropathy. PMH: Hypertension, Arrhythmia, GERD, Glaucoma, Chronic kidney disease with eGFR of 57,  History of B12 deficiency.  PAIN:  Are you  having pain? No  PRECAUTIONS: Fall  WEIGHT BEARING RESTRICTIONS: No  FALLS: Has patient fallen in last 6 months? No and a few near falls but has had no full falls in >2 years   LIVING ENVIRONMENT: Lives with: lives alone Lives in: House/apartment Stairs: Yes: Internal: 18 steps; on right going up and on left going up and External: 2 steps; on left going up Has following equipment at home: Single point cane, Grab bars, and walking stick  or hiking pole   PLOF: Independent, Independent with basic ADLs, Independent with household mobility with device, Requires assistive device for independence, and walking stick   PATIENT GOALS:  Increased strength in BLE Improve balance and be able to walk in the dark   OBJECTIVE:  Note: Objective measures were completed at Evaluation unless otherwise noted.                                                                                                                            TREATMENT DATE: 06/08/2024 -cable resisted AMB 12.5lb resistance: 4x fwd/backward; 4x backward/fwd -c 4lb AW bilat, ascend/descend 20 stairs, hands free (min Guard assist)  -4lb AW for 460ft AMB, eyes closed on straight-aways  -4lb AW for 46ft ABM and carrying sledgehammer ad lib  -tug of war training on firm surface 2x33ft 37.5lb, then twice again on airex foam  -airex foam overhead ball rebounding x12, includes step on/off to retrieve ball 4x   PATIENT EDUCATION: Education details: Pt educated on proper safety when ascending/descending steps/stairs at home.  Person educated: Patient Education method: Medical Illustrator Education comprehension: verbalized understanding and returned demonstration  HOME EXERCISE PROGRAM: Access Code: [email or web address] URL: [email or web address] Date: 03/31/2024 Prepared by: Massie Dollar  Exercises - Standing Tandem Balance with Counter Support  - 1 x daily - 3-4 x weekly - 3 sets - 10 reps - 30 seconds  hold -  Standing March with Counter Support  - 1 x daily - 3-4 x weekly - 3 sets - 10 reps - 2 seconds  hold - Standing Single Leg Stance with Counter Support  - 1 x daily - 3-4 x weekly - 3 sets - 10 reps - 5 seconds  hold - Sit to Stand with Armchair  - 1 x daily - 7 x weekly - 3 sets - 10 reps  GOALS: Goals reviewed with patient? Yes  SHORT TERM GOALS: Target date: 04/28/2024 Patient will be independent in home exercise program to improve strength/mobility for better functional independence with ADLs. Baseline: initiated 03/30/24 Goal status: Met. Pt demonstrates independence with current HEP.   LONG TERM GOALS: Target date: 06/23/2024  Patient will increase ABC scale score >80% to demonstrate better functional mobility and better confidence with ADLs.  Baseline: to be completed Goal status: Ongoing: 75%  2.  Patient (> 29 years old) will complete five times sit to stand test in <11 seconds indicating an increased LE strength and improved balance. Baseline: 14.09sec  05/04/2024: 10.07 sec Goal status: Met.   3.  Patient will increase Berg Balance score by > 6 points to demonstrate decreased fall risk during functional activities Baseline: 43 Goal status: Met: 51/56  4.  Patient will increase 10 meter walk test to >1.76m/s as to improve gait speed for better community ambulation and to reduce fall risk. Baseline:  0.84sec with walking stick Goal status: Met: 1.35 m/s  5.  Patient will reduce timed up and go to <11 seconds  to reduce fall risk and demonstrate improved transfer/gait ability. Baseline: 05/04/24:10.7'' Goal status: MET  6.  Patient will increase FGA score to >22/30 as to demonstrate reduced fall risk and improved dynamic gait balance for better safety with community/home ambulation.  Baseline: to be completed  Goal status: INITIAL  7. Patient will demonstrate improved gait mechanics with good bilateral foot clearance during swing phase when ambulating 1000' without AD.           Baseline (05/04/2024): Patient begins to have lack of foot clearance bilaterally when walking for prolonged distances.  Goal Status: Initial   8. Patient will demonstrate independence with exercise routine in the [facility] by discharge allowing him to transition his resistive and [facility] to continue improving strength/balance following PT discharge.   BASELINE (05/04/2024): Pt has yet to perform routine or receive instruction in [facility] and reports he really wishes to do so prior to discharge allowing him to go to local [facility] with improved confidence.   Goal Status: Initial  ASSESSMENT:  CLINICAL IMPRESSION: Pt continued to do well with functional based high-level balance activity. Controlling trunk is challenged by BLE neuropathy. Rest breaks provided as needed. Spent most of session mimicking meaningful activities giving ample opportunity to experience and self mange LOB when able. Patient will benefit from skilled physical therapy intervention to reduce deficits and impairments identified in evaluation, in order to reduce pain, improve quality of life, and maximize activity tolerance for ADL, IADL, and leisure/fitness. Physical therapy will help pt achieve long and short term goals of care.    OBJECTIVE IMPAIRMENTS: Abnormal gait, cardiopulmonary status limiting activity, decreased activity tolerance, decreased balance, decreased coordination, decreased endurance, decreased knowledge of condition, decreased knowledge of use of DME, decreased mobility, difficulty walking, decreased ROM, decreased strength, decreased safety awareness, dizziness, impaired flexibility, impaired sensation, and pain.   ACTIVITY LIMITATIONS: carrying, lifting, bending, standing, squatting, stairs, transfers, locomotion level, and caring for others  PARTICIPATION LIMITATIONS: cleaning, laundry, driving, shopping, community activity, and yard work  PERSONAL FACTORS: Age, Behavior pattern, and  3+ comorbidities:   are also affecting patient's functional outcome.   REHAB POTENTIAL: Excellent  CLINICAL DECISION MAKING: Evolving/moderate complexity  EVALUATION COMPLEXITY: Moderate  PLAN:  PT FREQUENCY: 1-2x/week  PT DURATION: 12 weeks  PLANNED INTERVENTIONS: 97164- PT Re-evaluation, 97750- Physical Performance Testing, 97110-Therapeutic exercises, 97530- Therapeutic activity, 97112- Neuromuscular re-education, 97535- Self Care, [id number]- Manual therapy, [phone]- Gait training, Patient/Family education, Balance training, Stair training, Joint mobilization, Joint manipulation, Vestibular training, Visual/preceptual remediation/compensation, DME instructions, Cryotherapy, and Moist heat  PLAN FOR NEXT SESSION:  -Continue working on dynamic and reactive balance with higher level balance activities.   3:41 PM, 06/08/2024 Peggye JAYSON Linear, PT, DPT Physical Therapist - [facility]  Outpatient Physical Therapy- [facility] [phone]     .     "

## 2024-06-13 ENCOUNTER — Ambulatory Visit

## 2024-06-14 ENCOUNTER — Ambulatory Visit: Attending: Neurology

## 2024-06-14 DIAGNOSIS — R269 Unspecified abnormalities of gait and mobility: Secondary | ICD-10-CM | POA: Insufficient documentation

## 2024-06-14 DIAGNOSIS — G5793 Unspecified mononeuropathy of bilateral lower limbs: Secondary | ICD-10-CM | POA: Diagnosis present

## 2024-06-14 DIAGNOSIS — R2689 Other abnormalities of gait and mobility: Secondary | ICD-10-CM | POA: Diagnosis present

## 2024-06-14 DIAGNOSIS — R262 Difficulty in walking, not elsewhere classified: Secondary | ICD-10-CM | POA: Diagnosis present

## 2024-06-14 NOTE — Therapy (Signed)
 " OUTPATIENT PHYSICAL THERAPY TREATMENT  Patient Name: Evan Forbes. MRN: [id number] DOB:Dec 02, 1931, 89 y.o., male Today's Date: 06/14/2024  PCP: Marsa Reyes Lenis, MD  REFERRING PROVIDER:   Maree Jannett POUR, MD    END OF SESSION:   PT End of Session - 06/14/24 1531     Visit Number 19    Number of Visits 24    Date for Recertification  06/22/24    Authorization Type Medicare A & B; Generic [employer] secondary    Progress Note Due on Visit 20    PT Start Time 1532    PT Stop Time 1613    PT Time Calculation (min) 41 min    Equipment Utilized During Treatment Gait belt    Activity Tolerance Patient tolerated treatment well;No increased pain;Patient limited by fatigue    Behavior During Therapy [facility] for tasks assessed/performed            Past Medical History:  Diagnosis Date   Cancer (HCC)    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 Patient reports no falls over the weekend. He reports that his neuropathy has been acting up a little.   PERTINENT HISTORY:  89yoM referred to OPPT for unsteadiness, imbalance 2/2 neuropathy. PMH: Hypertension, Arrhythmia, GERD, Glaucoma, Chronic kidney disease with eGFR of 57,  History of B12 deficiency.  PAIN:   Are you having pain? No  PRECAUTIONS: Fall  WEIGHT BEARING RESTRICTIONS: No  FALLS: Has patient fallen in last 6 months? No and a few near falls but has had no full falls in >2 years   LIVING ENVIRONMENT: Lives with: lives alone Lives in: House/apartment Stairs: Yes: Internal: 18 steps; on right going up and on left going up and External: 2 steps; on left going up Has following equipment at home: Single point cane, Grab bars, and walking  stick or hiking pole   PLOF: Independent, Independent with basic ADLs, Independent with household mobility with device, Requires assistive device for independence, and walking stick   PATIENT GOALS:  Increased strength in BLE Improve balance and be able to walk in the dark   OBJECTIVE:  Note: Objective measures were completed at Evaluation unless otherwise noted.                                                                                                                            TREATMENT DATE: 06/14/2024 -cable resisted AMB 17.5lb resistance: 4x fwd/backward; 4x backward/fwd, lateral x4 each.   -SLS 3x30'' at balance station with intermittent finger tapping.   -Ambulating up/down stairs with 4 lb. AW donned in reciprocating fashion for improved balance.   - 4lb AW bilat, ascend/descend 20 stairs, hands free (min Guard assist)   -4lb AW for 449ft ABM + outside over grass and mulch surfaces x300'.   -Tandem stance x1' x1 at balance station with finger tapping.    PATIENT EDUCATION: Education details: Pt educated on proper safety when ascending/descending steps/stairs at home.  Person educated: Patient Education method: Medical Illustrator Education comprehension: verbalized understanding and returned demonstration  HOME EXERCISE PROGRAM: Access Code: [email or web address] URL: [email or web address] Date: 03/31/2024 Prepared by: Massie Dollar  Exercises - Standing Tandem Balance with Counter Support  - 1 x daily - 3-4  x weekly - 3 sets - 10 reps - 30 seconds  hold - Standing March with Counter Support  - 1 x daily - 3-4 x weekly - 3 sets - 10 reps - 2 seconds  hold - Standing Single Leg Stance with Counter Support  - 1 x daily - 3-4 x weekly - 3 sets - 10 reps - 5 seconds  hold - Sit to Stand with Armchair  - 1 x daily - 7 x weekly - 3 sets - 10 reps  GOALS: Goals reviewed with patient? Yes  SHORT TERM GOALS: Target date: 04/28/2024 Patient will be independent in home exercise program to improve strength/mobility for better functional independence with ADLs. Baseline: initiated 03/30/24 Goal status: Met. Pt demonstrates independence with current HEP.   LONG TERM GOALS: Target date: 06/23/2024  Patient will increase ABC scale score >80% to demonstrate better functional mobility and better confidence with ADLs.  Baseline: to be completed Goal status: Ongoing: 75%  2.  Patient (> 39 years old) will complete five times sit to stand test in <11 seconds indicating an increased LE strength and improved balance. Baseline: 14.09sec  05/04/2024: 10.07 sec Goal status: Met.   3.  Patient will increase Berg Balance score by > 6 points to demonstrate decreased fall risk during functional activities Baseline: 43 Goal status: Met: 51/56  4.  Patient will increase 10 meter walk test to >1.38m/s as to improve gait speed for better community ambulation and to reduce fall risk. Baseline:  0.84sec with walking stick Goal status: Met: 1.35 m/s  5.  Patient will reduce timed up and go to <11 seconds to reduce fall risk and demonstrate improved transfer/gait ability. Baseline: 05/04/24:10.7'' Goal status: MET  6.  Patient will increase FGA score to >22/30 as to demonstrate reduced fall risk and improved dynamic gait balance for better safety with community/home ambulation.  Baseline: to be completed  Goal status: INITIAL  7. Patient will demonstrate improved gait mechanics with good bilateral foot clearance during  swing phase when ambulating 1000' without AD.          Baseline (05/04/2024): Patient begins to have lack of foot clearance bilaterally when walking for prolonged distances.  Goal Status: Initial   8. Patient will demonstrate independence with exercise routine in the [facility] by discharge allowing him to transition his resistive and aerobic [facility] to continue improving strength/balance following PT discharge.   BASELINE (05/04/2024): Pt has yet to perform routine or receive instruction in [facility] and reports he really wishes to do so prior to discharge allowing him to go to local [facility] with improved confidence.   Goal Status: Initial  ASSESSMENT:  CLINICAL IMPRESSION: Pt continued to do well with functional based high-level balance activity. Continue working to ambulate with ankle weights to promote foot clearance. Increased difficulty today with patient walking in thick grass and mulch. Patient did well to maintain balance on unstable surfaces, but it was slightly challenging for him and caused increased fatigue. Taught patient how to improve practice techniques with single leg stance which he reported he appreciated and would work on it at home. Continued with resisted cable walking to improve balance on slopes with gait and trunk control. Patient will benefit from skilled physical therapy intervention to reduce deficits and impairments identified in evaluation, in order to reduce pain, improve quality of life, and maximize activity tolerance for ADL, IADL, and leisure/fitness. Physical therapy will help pt achieve long and short term goals of care.    OBJECTIVE IMPAIRMENTS: Abnormal gait, cardiopulmonary status limiting activity, decreased activity tolerance, decreased balance, decreased coordination, decreased endurance, decreased knowledge of condition, decreased knowledge of use of DME, decreased mobility, difficulty walking, decreased ROM, decreased strength, decreased safety  awareness, dizziness, impaired flexibility, impaired sensation, and pain.   ACTIVITY LIMITATIONS: carrying, lifting, bending, standing, squatting, stairs, transfers, locomotion level, and caring for others  PARTICIPATION LIMITATIONS: cleaning, laundry, driving, shopping, community activity, and yard work  PERSONAL FACTORS: Age, Behavior pattern, and 3+ comorbidities:   are also affecting patient's functional outcome.   REHAB POTENTIAL: Excellent  CLINICAL DECISION MAKING: Evolving/moderate complexity  EVALUATION COMPLEXITY: Moderate  PLAN:  PT FREQUENCY: 1-2x/week  PT DURATION: 12 weeks  PLANNED INTERVENTIONS: 97164- PT Re-evaluation, 97750- Physical Performance Testing, 97110-Therapeutic exercises, 97530- Therapeutic activity, 97112- Neuromuscular re-education, 97535- Self Care, [id number]- Manual therapy, [phone]- Gait training, Patient/Family education, Balance training, Stair training, Joint mobilization, Joint manipulation, Vestibular training, Visual/preceptual remediation/compensation, DME instructions, Cryotherapy, and Moist heat  PLAN FOR NEXT SESSION:  -Continue working on dynamic and reactive balance with higher level balance activities.   5:18 PM, 06/14/2024 Norman Sharps, PT, DPT Physical Therapist - [place]  [facility]   .     "

## 2024-06-15 ENCOUNTER — Ambulatory Visit

## 2024-06-16 ENCOUNTER — Ambulatory Visit

## 2024-06-16 DIAGNOSIS — R2689 Other abnormalities of gait and mobility: Secondary | ICD-10-CM | POA: Diagnosis not present

## 2024-06-16 DIAGNOSIS — R262 Difficulty in walking, not elsewhere classified: Secondary | ICD-10-CM

## 2024-06-16 DIAGNOSIS — G5793 Unspecified mononeuropathy of bilateral lower limbs: Secondary | ICD-10-CM

## 2024-06-16 DIAGNOSIS — R269 Unspecified abnormalities of gait and mobility: Secondary | ICD-10-CM

## 2024-06-16 NOTE — Therapy (Signed)
 " OUTPATIENT PHYSICAL THERAPY TREATMENT  Patient Name: Evan Forbes. MRN: [id number] DOB:1931/08/31, 89 y.o., male Today's Date: 06/16/2024  PCP: Marsa Reyes Lenis, MD  REFERRING PROVIDER:   Maree Jannett POUR, MD    END OF SESSION:   PT End of Session - 06/16/24 1610     Visit Number 20    Number of Visits 24    Date for Recertification  06/22/24    Authorization Type Medicare A & B; Generic [employer] secondary    Progress Note Due on Visit 20    PT Start Time 0410    PT Stop Time 0450    PT Time Calculation (min) 40 min    Equipment Utilized During Treatment Gait belt    Activity Tolerance Patient tolerated treatment well;No increased pain;Patient limited by fatigue    Behavior During Therapy [facility] for tasks assessed/performed            Past Medical History:  Diagnosis Date   Cancer (HCC)    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 He reports no recent falls. He continues to work hard at home to improve SLS balance and tandem balance practicing daily.    PERTINENT HISTORY:  89yoM referred to OPPT for unsteadiness, imbalance 2/2 neuropathy. PMH: Hypertension, Arrhythmia, GERD, Glaucoma, Chronic kidney disease with eGFR of 57,  History of B12  deficiency.  PAIN:  Are you having pain? No  PRECAUTIONS: Fall  WEIGHT BEARING RESTRICTIONS: No  FALLS: Has patient fallen in last 6 months? No and a few near falls but has had no full falls in >2 years   LIVING ENVIRONMENT: Lives with: lives alone Lives in: House/apartment Stairs: Yes: Internal: 18 steps; on right going up and on left going up and External: 2 steps; on left going up Has following equipment at home: Single point cane,  Grab bars, and walking stick or hiking pole   PLOF: Independent, Independent with basic ADLs, Independent with household mobility with device, Requires assistive device for independence, and walking stick   PATIENT GOALS:  Increased strength in BLE Improve balance and be able to walk in the dark   OBJECTIVE:  Note: Objective measures were completed at Evaluation unless otherwise noted.                                                                                                                            TREATMENT DATE: 06/16/2024 -Ambulation: 1'800 ft. X2   -SLS 3x30'' at balance station with intermittent finger tapping.   -Tandem stance x1' x1 at balance station with finger tapping.  -PN (see LTG and Assessment sections).    PATIENT EDUCATION: Education details: Pt educated on proper safety when ascending/descending steps/stairs at home.  Person educated: Patient Education method: Medical Illustrator Education comprehension: verbalized understanding and returned demonstration  HOME EXERCISE PROGRAM: Access Code: [email or web address] URL: [email or web address] Date: 03/31/2024 Prepared by: Massie Dollar  Exercises - Standing Tandem Balance with Counter Support  - 1 x daily - 3-4 x weekly - 3 sets - 10 reps - 30 seconds  hold - Standing March with Counter Support  - 1 x daily - 3-4 x weekly - 3 sets - 10 reps - 2 seconds  hold - Standing Single Leg Stance with Counter Support  - 1 x daily - 3-4 x weekly - 3 sets - 10 reps  - 5 seconds  hold - Sit to Stand with Armchair  - 1 x daily - 7 x weekly - 3 sets - 10 reps  GOALS: Goals reviewed with patient? Yes  SHORT TERM GOALS: Target date: 04/28/2024 Patient will be independent in home exercise program to improve strength/mobility for better functional independence with ADLs. Baseline: initiated 03/30/24 Goal status: Met. Pt demonstrates independence with current HEP.   LONG TERM GOALS: Target date: 06/23/2024  Patient will increase ABC scale score >80% to demonstrate better functional mobility and better confidence with ADLs.  Baseline: to be completed Goal status: Ongoing: 75%  2.  Patient (> 23 years old) will complete five times sit to stand test in <11 seconds indicating an increased LE strength and improved balance. Baseline: 14.09sec  05/04/2024: 10.07 sec Goal status: Met.   3.  Patient will increase Berg Balance score by > 6 points to demonstrate decreased fall risk during functional activities Baseline: 43 Goal status: Met: 51/56  4.  Patient will increase 10 meter walk test to >1.47m/s as to improve gait speed for better community ambulation and to reduce fall risk. Baseline:  0.84sec with walking stick Goal status: Met: 1.35 m/s  5.  Patient will reduce timed up and go to <11 seconds to reduce fall risk and demonstrate improved transfer/gait ability. Baseline: 05/04/24:10.7'' Goal status: MET   6. Patient will demonstrate improved gait mechanics with good bilateral foot clearance during swing phase when  ambulating 1000' without AD.          Baseline (05/04/2024): Patient begins to have lack of foot clearance bilaterally when walking for prolonged distances.  Goal Status: Met. Patient demonstrates good gait mechanics with bilateral foot clearance with 2 gait trials over 1500'.    7. Patient will demonstrate independence with exercise routine in the [facility] by discharge allowing him to transition his resistive and aerobic [facility] to continue improving strength/balance following PT discharge.   BASELINE (05/04/2024): Pt has yet to perform routine or receive instruction in [facility] and reports he really wishes to do so prior to discharge allowing him to [facility] to [facility] with improved confidence.   Goal Status: Discontinued.   ASSESSMENT:  CLINICAL IMPRESSION: Today was patient's 20th visit and a progress note was taken. Subjectively, patient reports he has been doing well and has had no recent falls. He continues to work hard on SLS and tandem stance at home. He reports not much improvement has occurred with SLS, but tandem stance is much better. He also reports that his neuropathy is down to about 8'' above the ankles when it used to be to the knees. He states that he can do most functional activities without issues, but he is still not able to stand on one foot without losing balance. He also reports that he still feels like if he gets pulled backward that his balance is not as good at reactive balance in other directions. Patient met gait mechanics goal walking 1800' x2 with good foot clearance, balance, and stride length. He also demonstrated ability to stand tandem for 30'' interval without having to catch himself due to LOB. Patient still has much difficulty with SLS only demonstrating a few seconds of SLS at a time before LOB. Overall, patient and I discussed his progress thus far and his current POC. I recommended to patient that since he is able to functionally perform the activities that he wishes to perform and are appropriate at his age without difficulty that he discontinue PT at this time and continue his HEP on his own. Also due to patient's high motivation and consistency with HEP at home, patient and I are both confident that he is able to continue working on his balance at home safely and consisently. I also recommended that he continue to stay active, taking walks on his trails and [place] as long as they are appropriate for his age to be doing so. Patient is very pleased with the progress he has made in PT along with the education he has received throughout his POC. Patient will be discharged at this time with most goals met and ability to continue HEP independently at this time.    OBJECTIVE IMPAIRMENTS: Abnormal gait, cardiopulmonary status limiting activity, decreased activity tolerance, decreased balance, decreased coordination, decreased endurance, decreased knowledge of condition, decreased knowledge of use of DME, decreased mobility, difficulty walking, decreased ROM, decreased strength, decreased safety awareness, dizziness, impaired flexibility, impaired sensation, and pain.   ACTIVITY LIMITATIONS: carrying, lifting, bending, standing, squatting, stairs, transfers, locomotion level, and caring for others  PARTICIPATION LIMITATIONS: cleaning, laundry, driving, shopping, community activity, and yard work  PERSONAL FACTORS: Age, Behavior pattern, and 3+ comorbidities:   are also affecting patient's functional outcome.   REHAB POTENTIAL: Excellent  CLINICAL DECISION MAKING: Evolving/moderate complexity  EVALUATION COMPLEXITY: Moderate  PLAN:  PT FREQUENCY: 1-2x/week  PT DURATION: 12 weeks  PLANNED INTERVENTIONS: 02835- PT Re-evaluation,  02249- Physical Performance Testing, 97110-Therapeutic exercises, 97530- Therapeutic activity, [id number]- Neuromuscular re-education, [phone]- Self Care, 02859- Manual therapy, [phone]- Gait training, Patient/Family education, Balance training, Stair training, Joint mobilization, Joint manipulation, Vestibular training, Visual/preceptual remediation/compensation, DME instructions, Cryotherapy, and Moist heat  PLAN FOR NEXT SESSION:  -Discharge patient. Patient to continue HEP independently at this time.   5:28 PM, 06/16/2024 Norman Sharps, PT, DPT Physical Therapist - [place]  [facility]   .     "

## 2024-06-20 ENCOUNTER — Ambulatory Visit

## 2024-06-21 ENCOUNTER — Ambulatory Visit

## 2024-06-22 ENCOUNTER — Ambulatory Visit

## 2024-06-23 ENCOUNTER — Ambulatory Visit

## 2024-06-27 ENCOUNTER — Ambulatory Visit

## 2024-06-28 ENCOUNTER — Ambulatory Visit

## 2024-06-29 ENCOUNTER — Ambulatory Visit

## 2024-06-30 ENCOUNTER — Ambulatory Visit

## 2024-07-04 ENCOUNTER — Ambulatory Visit

## 2024-07-05 ENCOUNTER — Ambulatory Visit

## 2024-07-06 ENCOUNTER — Ambulatory Visit

## 2024-07-07 ENCOUNTER — Ambulatory Visit

## 2024-07-12 ENCOUNTER — Ambulatory Visit

## 2024-07-14 ENCOUNTER — Ambulatory Visit

## 2024-11-23 ENCOUNTER — Ambulatory Visit: Admitting: Urology
